# Patient Record
Sex: Female | Born: 1980 | Race: White | Hispanic: No | Marital: Married | Smoking: Former smoker
Health system: Southern US, Community
[De-identification: ages and names within clinical notes are randomized; demographics above are authoritative.]

## PROBLEM LIST (undated history)

## (undated) ENCOUNTER — Inpatient Hospital Stay (HOSPITAL_COMMUNITY): Payer: BC Managed Care – PPO

## (undated) DIAGNOSIS — O09899 Supervision of other high risk pregnancies, unspecified trimester: Secondary | ICD-10-CM

## (undated) DIAGNOSIS — K900 Celiac disease: Secondary | ICD-10-CM

## (undated) HISTORY — DX: Supervision of other high risk pregnancies, unspecified trimester: O09.899

## (undated) HISTORY — DX: Celiac disease: K90.0

---

## 2005-10-30 DIAGNOSIS — K900 Celiac disease: Secondary | ICD-10-CM

## 2005-10-30 HISTORY — DX: Celiac disease: K90.0

## 2006-06-18 ENCOUNTER — Ambulatory Visit: Payer: Self-pay | Admitting: Internal Medicine

## 2006-06-30 HISTORY — PX: ESOPHAGOGASTRODUODENOSCOPY: SHX1529

## 2006-07-04 ENCOUNTER — Ambulatory Visit: Payer: Self-pay | Admitting: Internal Medicine

## 2006-07-04 ENCOUNTER — Ambulatory Visit (HOSPITAL_COMMUNITY): Admission: RE | Admit: 2006-07-04 | Discharge: 2006-07-04 | Payer: Self-pay | Admitting: Internal Medicine

## 2006-07-04 ENCOUNTER — Encounter (INDEPENDENT_AMBULATORY_CARE_PROVIDER_SITE_OTHER): Payer: Self-pay | Admitting: Specialist

## 2006-07-17 ENCOUNTER — Ambulatory Visit: Payer: Self-pay | Admitting: Internal Medicine

## 2007-01-24 ENCOUNTER — Ambulatory Visit: Payer: Self-pay | Admitting: Gastroenterology

## 2010-06-21 ENCOUNTER — Encounter (INDEPENDENT_AMBULATORY_CARE_PROVIDER_SITE_OTHER): Payer: Self-pay | Admitting: *Deleted

## 2010-11-29 NOTE — Letter (Signed)
Summary: Recall Radiology  [facility]  [address]   [place], [place] [address]   Phone: [phone]  Fax: [phone]    June 21, 2010  KATHLYN LEACHMAN [address] [place], [place]  [address] 1981-02-21   Dear Ms. Renaldo Fiddler,   Our office needs to get you scheduled for your Bone Density Study. Please give our office a call to schedule this.  You may call the office at your convenience at [phone].  Please ask for the Referral Coordinator to make arrangements for this to be scheduled.  You may have to leave a message on our voice mail.  We will return your call.  If for any reason you do not wish to schedule this, please advise the office.  Please do not neglect your health.   Thank you,    Ave Filter  [facility] Ph: [phone]   Fax: [phone]

## 2011-03-17 NOTE — Op Note (Signed)
NAME:  Olivia Vance, Olivia Vance NO.:  [id number]   MEDICAL RECORD NO.:  [id number]          PATIENT TYPE:  AMB   LOCATION:  DAY                           FACILITY:  [facility]   PHYSICIAN:  R. Roetta Sessions, M.D. DATE OF BIRTH:  06/23/1981   DATE OF PROCEDURE:  07/04/2006  DATE OF DISCHARGE:                                 OPERATIVE REPORT   PROCEDURE PERFORMED:  Esophagogastroduodenoscopy with small bowel biopsy.   ENDOSCOPIST:  Jonathon Bellows, M.D.   INDICATIONS FOR THE PROCEDURE:  The patient is a 30 year old lady who came  to see me recently for further evaluation of a two to three-year history of  intermittent diarrhea.  She does have a significant nocturnal component.  I  sent off a celiac antibody panel and the entire antibody specimen came back  positive.  She was started on a gluten-free diet last week and already her  diarrhea has markedly improved, and she can sleep through the night now  without having to get up to have a bowel movement.   Of note, labs recently revealed leukopenia and anemia on June 28, 2006 her  white count was 3.4, H&H was 8.3 and 27.5, MCV  63.2  EGD is now being done  to biopsy her small bowel to nail down the diagnosis of celiac disease.  This procedure was discussed with the patient at length; including the  potential risks, benefits and alternatives having been reviewed. Questions  were answered; please see the  documentation in the medical record.   DESCRIPTION OF THE PROCEDURE:  Oxygen saturation, blood pressure, pulse and  respirations were monitored throughout the entire procedure.  Conscious  sedation consisted of Versed 6 mg and 125 mg of Demerol IV in divided doses.  The instrument consisted of the Olympus video system.   Findings:  Examination to the esophagus revealed no abnormalities.   The gastroesophageal junction was easily traversed.   On examination of the stomach the gastric cavity was empty and __________  there.   Thorough examination of the gastric mucosa was done.  Retroflexion  in the proximal stomach and esophagogastric junction demonstrated only a  small hiatal hernia.   The pylorus was patent and easily traversed.  Examination of the bulb and  the second portion revealed markedly abnormal appearance of the duodenum.  The bulb was elongated and large.  The second and third portions had a pale  appearance with loss of the typical folds seen, and there was scalloping of  the mucosa diffusely.  Multiple biopsies of the second and third portions of  the duodenum were taken for histologic study.   The patient tolerated the procedure well.  The patient was reactive after  the endoscopy.   IMPRESSION:  1. Normal esophagus.  2. Small hiatal hernia.  3. Otherwise normal stomach.  4. Patent pylorus.  5. Elongated pale-appearing duodenal bulb.  6. Abnormal-appearing second and third portions of the duodenum as      described above; biopsy.   RECOMMENDATIONS:  1. Continue gluten-free diet with internal celiac disease __________ .  2. We are going  to check a baseline B12 and serum folate level.  3. Further recommendations to follow.      Jonathon Bellows, M.D.  Electronically Signed     RMR/MEDQ  D:  07/04/2006  T:  07/05/2006  Job:  [id number]   cc:   Almetta Lovely  Fax: [phone]   Fara Chute  Fax: [phone]

## 2011-03-31 DIAGNOSIS — O09899 Supervision of other high risk pregnancies, unspecified trimester: Secondary | ICD-10-CM

## 2011-03-31 HISTORY — DX: Supervision of other high risk pregnancies, unspecified trimester: O09.899

## 2011-04-19 ENCOUNTER — Inpatient Hospital Stay (HOSPITAL_COMMUNITY)
Admission: AD | Admit: 2011-04-19 | Discharge: 2011-04-23 | DRG: 372 | Disposition: A | Discharge status: Home / Self Care | Payer: BC Managed Care – PPO | Attending: Obstetrics and Gynecology | Admitting: Obstetrics and Gynecology

## 2011-04-19 DIAGNOSIS — O1414 Severe pre-eclampsia complicating childbirth: Principal | ICD-10-CM | POA: Diagnosis present

## 2011-04-19 LAB — LACTATE DEHYDROGENASE: LDH: 267 U/L — ABNORMAL HIGH (ref 94–250)

## 2011-04-19 LAB — COMPREHENSIVE METABOLIC PANEL
BUN: 7 mg/dL (ref 6–23)
Calcium: 8.4 mg/dL (ref 8.4–10.5)
GFR calc Af Amer: >60 mL/min (ref >60)
Glucose, Bld: 84 mg/dL (ref 70–99)
Sodium: 133 mEq/L — ABNORMAL LOW (ref 135–145)
Total Protein: 5.4 g/dL — ABNORMAL LOW (ref 6.0–8.3)

## 2011-04-19 LAB — CBC
Hemoglobin: 11.7 g/dL — ABNORMAL LOW (ref 12.0–15.0)
MCH: 30.2 pg (ref 26.0–34.0)
MCHC: 33.9 g/dL (ref 30.0–36.0)

## 2011-04-19 LAB — URIC ACID: Uric Acid, Serum: 9.2 mg/dL — ABNORMAL HIGH (ref 2.4–7.0)

## 2011-04-20 ENCOUNTER — Inpatient Hospital Stay (HOSPITAL_COMMUNITY): Payer: BC Managed Care – PPO

## 2011-04-20 LAB — MAGNESIUM
Magnesium: 6.6 mg/dL (ref 1.5–2.5)
Magnesium: 6.6 mg/dL (ref 1.5–2.5)

## 2011-04-20 LAB — COMPREHENSIVE METABOLIC PANEL
ALT: 178 U/L — ABNORMAL HIGH (ref 0–35)
AST: 67 U/L — ABNORMAL HIGH (ref 0–37)
AST: 229 U/L — ABNORMAL HIGH (ref 0–37)
Albumin: 2 g/dL — ABNORMAL LOW (ref 3.5–5.2)
CO2: 19 mEq/L (ref 19–32)
CO2: 21 mEq/L (ref 19–32)
Calcium: 7.5 mg/dL — ABNORMAL LOW (ref 8.4–10.5)
Calcium: 7.1 mg/dL — ABNORMAL LOW (ref 8.4–10.5)
Creatinine, Ser: 0.92 mg/dL (ref 0.50–1.10)
GFR calc non Af Amer: >60 mL/min (ref >60)
Sodium: 128 mEq/L — ABNORMAL LOW (ref 135–145)
Total Protein: 5 g/dL — ABNORMAL LOW (ref 6.0–8.3)

## 2011-04-20 LAB — CBC
MCH: 31.7 pg (ref 26.0–34.0)
MCH: 30.2 pg (ref 26.0–34.0)
MCHC: 34.3 g/dL (ref 30.0–36.0)
MCV: 88.2 fL (ref 78.0–100.0)
Platelets: 66 10*3/uL — ABNORMAL LOW (ref 150–400)
Platelets: 54 10*3/uL — ABNORMAL LOW (ref 150–400)
RBC: 3.25 MIL/uL — ABNORMAL LOW (ref 3.87–5.11)
RDW: 13.4 % (ref 11.5–15.5)

## 2011-04-20 LAB — LIPASE, BLOOD: Lipase: 18 U/L (ref 11–59)

## 2011-04-20 LAB — AMYLASE: Amylase: 31 U/L (ref 0–105)

## 2011-04-20 LAB — URIC ACID: Uric Acid, Serum: 9.4 mg/dL — ABNORMAL HIGH (ref 2.4–7.0)

## 2011-04-21 LAB — DIFFERENTIAL
Basophils Absolute: 0 10*3/uL (ref 0.0–0.1)
Basophils Relative: 0 % (ref 0–1)
Neutro Abs: 3.7 10*3/uL (ref 1.7–7.7)
Neutrophils Relative %: 70 % (ref 43–77)

## 2011-04-21 LAB — COMPREHENSIVE METABOLIC PANEL
ALT: 313 U/L — ABNORMAL HIGH (ref 0–35)
AST: 440 U/L — ABNORMAL HIGH (ref 0–37)
Albumin: 1.9 g/dL — ABNORMAL LOW (ref 3.5–5.2)
Alkaline Phosphatase: 135 U/L — ABNORMAL HIGH (ref 39–117)
Alkaline Phosphatase: 155 U/L — ABNORMAL HIGH (ref 39–117)
BUN: 7 mg/dL (ref 6–23)
CO2: 24 mEq/L (ref 19–32)
CO2: 23 mEq/L (ref 19–32)
Chloride: 103 mEq/L (ref 96–112)
Creatinine, Ser: 0.6 mg/dL (ref 0.50–1.10)
GFR calc Af Amer: >60 mL/min (ref >60)
GFR calc Af Amer: >60 mL/min (ref >60)
GFR calc non Af Amer: >60 mL/min (ref >60)
GFR calc non Af Amer: >60 mL/min (ref >60)
Glucose, Bld: 89 mg/dL (ref 70–99)
Glucose, Bld: 133 mg/dL — ABNORMAL HIGH (ref 70–99)
Potassium: 4.3 mEq/L (ref 3.5–5.1)
Potassium: 4.4 mEq/L (ref 3.5–5.1)
Sodium: 128 mEq/L — ABNORMAL LOW (ref 135–145)
Total Bilirubin: 0.4 mg/dL (ref 0.3–1.2)
Total Protein: 4.4 g/dL — ABNORMAL LOW (ref 6.0–8.3)

## 2011-04-21 LAB — CBC
HCT: 26.2 % — ABNORMAL LOW (ref 36.0–46.0)
Hemoglobin: 7.8 g/dL — ABNORMAL LOW (ref 12.0–15.0)
Hemoglobin: 8.9 g/dL — ABNORMAL LOW (ref 12.0–15.0)
MCV: 87.6 fL (ref 78.0–100.0)
Platelets: 14 10*3/uL — CL (ref 150–400)
RBC: 2.6 MIL/uL — ABNORMAL LOW (ref 3.87–5.11)
RDW: 14.3 % (ref 11.5–15.5)
WBC: 6 10*3/uL (ref 4.0–10.5)

## 2011-04-21 LAB — LACTATE DEHYDROGENASE: LDH: 925 U/L — ABNORMAL HIGH (ref 94–250)

## 2011-04-21 LAB — URIC ACID: Uric Acid, Serum: 8.9 mg/dL — ABNORMAL HIGH (ref 2.4–7.0)

## 2011-04-22 LAB — MAGNESIUM: Magnesium: 3.5 mg/dL — ABNORMAL HIGH (ref 1.5–2.5)

## 2011-04-22 LAB — CBC
HCT: 20.4 % — ABNORMAL LOW (ref 36.0–46.0)
Hemoglobin: 6.9 g/dL — CL (ref 12.0–15.0)
MCV: 88.3 fL (ref 78.0–100.0)
RDW: 14.2 % (ref 11.5–15.5)
WBC: 8 10*3/uL (ref 4.0–10.5)

## 2011-04-22 LAB — LACTATE DEHYDROGENASE: LDH: 519 U/L — ABNORMAL HIGH (ref 94–250)

## 2011-04-22 LAB — COMPREHENSIVE METABOLIC PANEL
Albumin: 1.7 g/dL — ABNORMAL LOW (ref 3.5–5.2)
Alkaline Phosphatase: 118 U/L — ABNORMAL HIGH (ref 39–117)
BUN: 8 mg/dL (ref 6–23)
CO2: 23 mEq/L (ref 19–32)
Chloride: 107 mEq/L (ref 96–112)
Creatinine, Ser: 0.67 mg/dL (ref 0.50–1.10)
GFR calc Af Amer: >60 mL/min (ref >60)
GFR calc non Af Amer: >60 mL/min (ref >60)
Glucose, Bld: 112 mg/dL — ABNORMAL HIGH (ref 70–99)
Potassium: 4 mEq/L (ref 3.5–5.1)
Total Bilirubin: 0.2 mg/dL — ABNORMAL LOW (ref 0.3–1.2)

## 2011-04-23 LAB — COMPREHENSIVE METABOLIC PANEL
ALT: 140 U/L — ABNORMAL HIGH (ref 0–35)
AST: 61 U/L — ABNORMAL HIGH (ref 0–37)
Calcium: 8.1 mg/dL — ABNORMAL LOW (ref 8.4–10.5)
Creatinine, Ser: 0.63 mg/dL (ref 0.50–1.10)
GFR calc non Af Amer: >60 mL/min (ref >60)
Sodium: 140 mEq/L (ref 135–145)
Total Protein: 4.2 g/dL — ABNORMAL LOW (ref 6.0–8.3)

## 2011-04-23 LAB — CBC
MCH: 30.4 pg (ref 26.0–34.0)
MCHC: 33.5 g/dL (ref 30.0–36.0)
MCV: 90.6 fL (ref 78.0–100.0)
Platelets: 68 10*3/uL — ABNORMAL LOW (ref 150–400)
RDW: 14.3 % (ref 11.5–15.5)

## 2011-04-23 LAB — URIC ACID: Uric Acid, Serum: 8.8 mg/dL — ABNORMAL HIGH (ref 2.4–7.0)

## 2011-05-13 ENCOUNTER — Inpatient Hospital Stay (HOSPITAL_COMMUNITY)
Admission: AD | Admit: 2011-05-13 | Discharge: 2011-05-13 | Disposition: A | Discharge status: Home / Self Care | Payer: BC Managed Care – PPO | Source: Ambulatory Visit | Attending: Obstetrics and Gynecology | Admitting: Obstetrics and Gynecology

## 2011-05-13 ENCOUNTER — Encounter (HOSPITAL_COMMUNITY): Payer: Self-pay | Admitting: *Deleted

## 2011-05-13 DIAGNOSIS — O9122 Nonpurulent mastitis associated with the puerperium: Secondary | ICD-10-CM | POA: Insufficient documentation

## 2011-05-13 DIAGNOSIS — N61 Mastitis without abscess: Secondary | ICD-10-CM

## 2011-05-13 LAB — COMPREHENSIVE METABOLIC PANEL
ALT: 31 U/L (ref 0–35)
AST: 22 U/L (ref 0–37)
CO2: 23 mEq/L (ref 19–32)
Chloride: 95 mEq/L — ABNORMAL LOW (ref 96–112)
GFR calc Af Amer: >60 mL/min (ref >60)
GFR calc non Af Amer: >60 mL/min (ref >60)
Glucose, Bld: 99 mg/dL (ref 70–99)
Sodium: 128 mEq/L — ABNORMAL LOW (ref 135–145)
Total Bilirubin: 0.5 mg/dL (ref 0.3–1.2)

## 2011-05-13 LAB — DIFFERENTIAL
Basophils Absolute: 0 10*3/uL (ref 0.0–0.1)
Eosinophils Relative: 0 % (ref 0–5)
Lymphocytes Relative: 6 % — ABNORMAL LOW (ref 12–46)
Lymphs Abs: 0.5 10*3/uL — ABNORMAL LOW (ref 0.7–4.0)
Monocytes Absolute: 0.4 10*3/uL (ref 0.1–1.0)
Neutro Abs: 7.4 10*3/uL (ref 1.7–7.7)

## 2011-05-13 LAB — CBC
HCT: 31.9 % — ABNORMAL LOW (ref 36.0–46.0)
MCV: 86 fL (ref 78.0–100.0)
Platelets: 174 10*3/uL (ref 150–400)
RBC: 3.71 MIL/uL — ABNORMAL LOW (ref 3.87–5.11)
RDW: 14.4 % (ref 11.5–15.5)
WBC: 8.3 10*3/uL (ref 4.0–10.5)

## 2011-05-13 LAB — URINALYSIS, ROUTINE W REFLEX MICROSCOPIC
Bilirubin Urine: NEGATIVE
Ketones, ur: NEGATIVE mg/dL
Nitrite: NEGATIVE
Protein, ur: NEGATIVE mg/dL
Urobilinogen, UA: 0.2 mg/dL (ref 0.0–1.0)

## 2011-05-13 LAB — URINE MICROSCOPIC-ADD ON

## 2011-05-13 NOTE — Progress Notes (Signed)
MD told to come in ?mastitis, having pain in right breast and fever this morning of 102.

## 2011-05-13 NOTE — Progress Notes (Signed)
Pt reports pain in R breast since yesterday, pt was treated for mastitis 2 weeks ago. ANd is now taking abx, dr. Henderson Cloud called in last night.

## 2011-05-13 NOTE — ED Provider Notes (Addendum)
History   This patient was treated with antibiotics 2 weeks ago for mastitis. She stopped breast feeding at that time and has been using the breast pump. For the past couple days has developed fever and breast tenderness again. Called Dr. Henderson Cloud last pm and started on antibiotics. Here today for evaluation and blood work.  Chief Complaint  Patient presents with  . Fever  . Breast Pain    right    Patient is a 30 y.o. female presenting with fever. The history is provided by the patient.  Fever Primary symptoms of the febrile illness include fever. Primary symptoms do not include headaches. This is a recurrent problem.    OB History    Grav Para Term Preterm Abortions TAB SAB Ect Mult Living   1 1 1       1       No past medical history on file.  No past surgical history on file.  No family history on file.  History  Substance Use Topics  . Smoking status: Not on file  . Smokeless tobacco: Not on file  . Alcohol Use: Not on file    Allergies: No Known Allergies  No prescriptions prior to admission    Review of Systems  Constitutional: Positive for fever.  Eyes: Negative for blurred vision and double vision.  Genitourinary:       Rt. Breast tenderness and redness.  Neurological: Negative for headaches.   Physical Exam   Blood pressure 111/72, pulse 140, temperature 99.4 F (37.4 C), temperature source Oral, resp. rate 16, height 5\' 5"  (1.651 m), weight 135 lb (61.236 kg), unknown if currently breastfeeding.  Physical Exam  Nursing note and vitals reviewed. Constitutional: She appears well-developed and well-nourished. No distress.  HENT:  Head: Normocephalic.  Eyes: EOM are normal.  Neck: Neck supple.  Respiratory: Effort normal.  Genitourinary: There is breast swelling and tenderness (of the right ).  Musculoskeletal: Normal range of motion.    MAU Course  Procedures  MDM: Dr. Henderson Cloud here to evaluate the patient and review lab results.    [place],  [place] 05/13/11 1239

## 2011-05-13 NOTE — Consult Note (Signed)
Consult with Dr. Henderson Cloud regarding clinical findings on this patient. She request CBC, CMET and UA. Lactation Consultant, Otelia Santee in to talk with patient. Will call Dr. Henderson Cloud with lab results.

## 2011-05-13 NOTE — H&P (Signed)
Addendum to NP note.  Filed Vitals:   05/13/11 1026 05/13/11 1122  BP: 111/72 104/66  Pulse: 140 111  Temp: 99.4 F (37.4 C) 98.9 F (37.2 C)  TempSrc: Oral Oral  Resp: 16 16  Height: 5\' 5"  (1.651 m)   Weight: 61.236 kg (135 lb)     Breasts: No other signs of infection.  Results for orders placed during the hospital encounter of 05/13/11 (from the past 24 hour(s))  CBC     Status: Abnormal   Collection Time   05/13/11 11:11 AM      Component Value Range   WBC 8.3  4.0 - 10.5 (K/uL)   RBC 3.71 (*) 3.87 - 5.11 (MIL/uL)   Hemoglobin 10.2 (*) 12.0 - 15.0 (g/dL)   HCT 09.8 (*) 11.9 - 46.0 (%)   MCV 86.0  78.0 - 100.0 (fL)   MCH 27.5  26.0 - 34.0 (pg)   MCHC 32.0  30.0 - 36.0 (g/dL)   RDW 14.7  82.9 - 56.2 (%)   Platelets 174  150 - 400 (K/uL)  DIFFERENTIAL     Status: Abnormal   Collection Time   05/13/11 11:11 AM      Component Value Range   Neutrophils Relative 89 (*) 43 - 77 (%)   Neutro Abs 7.4  1.7 - 7.7 (K/uL)   Lymphocytes Relative 6 (*) 12 - 46 (%)   Lymphs Abs 0.5 (*) 0.7 - 4.0 (K/uL)   Monocytes Relative 5  3 - 12 (%)   Monocytes Absolute 0.4  0.1 - 1.0 (K/uL)   Eosinophils Relative 0  0 - 5 (%)   Eosinophils Absolute 0.0  0.0 - 0.7 (K/uL)   Basophils Relative 0  0 - 1 (%)   Basophils Absolute 0.0  0.0 - 0.1 (K/uL)  COMPREHENSIVE METABOLIC PANEL     Status: Abnormal   Collection Time   05/13/11 11:11 AM      Component Value Range   Sodium 128 (*) 135 - 145 (mEq/L)   Potassium 3.7  3.5 - 5.1 (mEq/L)   Chloride 95 (*) 96 - 112 (mEq/L)   CO2 23  19 - 32 (mEq/L)   Glucose, Bld 99  70 - 99 (mg/dL)   BUN 11  6 - 23 (mg/dL)   Creatinine, Ser 1.30  0.50 - 1.10 (mg/dL)   Calcium 9.2  8.4 - 86.5 (mg/dL)   Total Protein 7.2  6.0 - 8.3 (g/dL)   Albumin 3.5  3.5 - 5.2 (g/dL)   AST 22  0 - 37 (U/L)   ALT 31  0 - 35 (U/L)   Alkaline Phosphatase 95  39 - 117 (U/L)   Total Bilirubin 0.5  0.3 - 1.2 (mg/dL)   GFR calc non Af Amer >60  >60 (mL/min)   GFR calc Af Amer  >60  >60 (mL/min)   A/P  Mastitis.  No other signs of infection.  WBC normal.  No s/s preeclampsia.  D/C and continue Dicloxacillin.

## 2011-05-31 ENCOUNTER — Telehealth: Payer: Self-pay

## 2011-05-31 NOTE — Telephone Encounter (Signed)
Pt called (she is also known to [place] at [facility]) pt stated she has celiac dz and RMR put her on B-12 injections several years ago and she is now having the same problems as before and wanted to know if RMR would send in Rx for B-12 injections. Informed pt that we have not seen her in the office since 2008 and she would need to have an OV. Pt stated she didn't have her schedule with her and  would call back to schedule appt.

## 2011-06-05 NOTE — Telephone Encounter (Signed)
She definitely needs a return visit

## 2011-06-06 ENCOUNTER — Encounter: Payer: Self-pay | Admitting: Internal Medicine

## 2011-06-06 NOTE — Telephone Encounter (Signed)
No return call from pt. I made OV for 9/21 @ 1030 with LSL and mailed appt card to patient.

## 2011-07-17 ENCOUNTER — Encounter: Payer: Self-pay | Admitting: Gastroenterology

## 2011-07-17 ENCOUNTER — Ambulatory Visit (INDEPENDENT_AMBULATORY_CARE_PROVIDER_SITE_OTHER): Payer: BC Managed Care – PPO | Admitting: Gastroenterology

## 2011-07-17 VITALS — BP 110/70 | HR 86 | Temp 97.9°F | Ht 65.0 in | Wt 127.6 lb

## 2011-07-17 DIAGNOSIS — E538 Deficiency of other specified B group vitamins: Secondary | ICD-10-CM

## 2011-07-17 DIAGNOSIS — R7989 Other specified abnormal findings of blood chemistry: Secondary | ICD-10-CM

## 2011-07-17 DIAGNOSIS — D649 Anemia, unspecified: Secondary | ICD-10-CM | POA: Insufficient documentation

## 2011-07-17 DIAGNOSIS — R945 Abnormal results of liver function studies: Secondary | ICD-10-CM | POA: Insufficient documentation

## 2011-07-17 DIAGNOSIS — K900 Celiac disease: Secondary | ICD-10-CM | POA: Insufficient documentation

## 2011-07-17 NOTE — Progress Notes (Signed)
Cc to PCP 

## 2011-07-17 NOTE — Assessment & Plan Note (Signed)
Celiac disease, no diarrhea. Uneventful pregnancy, however developed HELLP syndrome. Post delivery her hemoglobin was in the 6 range. In July it was up to 8. No recent B12 or iron levels obtained. LFTs were abnormal as well.  Followup on labs. Advised patient that we would not resume B12 injections unless she is proved to be deficient. Recommend her having followup DEXA bone density study as previously requested.  Further recommendations to follow.

## 2011-07-17 NOTE — Progress Notes (Signed)
Primary Care Physician:  Monica Becton, MD  Primary Gastroenterologist:  Roetta Sessions, MD   Chief Complaint  Patient presents with  . Medication Refill    HPI:  Olivia Vance is a 30 y.o. female here for followup of celiac disease. She was last seen in 2008. She called recently requesting B12 injections. She was on them back in 2008 for several months. At that time she was having insomnia which improved with B12 injections. Complains of insomnia again. Since we last saw her she delivered a healthy daughter on 04/20/2011. Her pregnancy was uneventful however post delivery she developed HELLP syndrome.   She maintains a strict gluten free diet. Denies any diarrhea. She is back to her prepregnancy weight. No abdominal pain, heartburn, vomiting. She was unable to have her followup bone density study because she was pregnant. DEXA scan in August of 2008 showed minimal decrease in the patient's bone density at the femoral neck and left total hip with T-scores of -1.01 and -1.32. It was recommended that she have vitamin D level drawn, this was normal. Dr. Jena Gauss advised Os-Cal and repeat study in 3 years.   Current Outpatient Prescriptions  Medication Sig Dispense Refill  . acetaminophen (TYLENOL) 325 MG tablet Take 650 mg by mouth every 6 (six) hours as needed. For pain.         Allergies as of 07/17/2011  . (No Known Allergies)    Past Medical History  Diagnosis Date  . Celiac disease 2007    Biopsy proven.  . Previous pregnancy with HELLP syndrome, antepartum 03/2011    Past Surgical History  Procedure Date  . Esophagogastroduodenoscopy 06/2006    small hh, pale duodenal bulb, 2nd and 3rd portions of duodenum abnormal appearing. Bx c/w celiac disease. Positive serologies.    Family History  Problem Relation Age of Onset  . Irritable bowel syndrome Mother   . GER disease Father     History   Social History  . Marital Status: Married    Spouse Name: N/A    Number of  Children: 1  . Years of Education: N/A   Occupational History  . teacher    Social History Main Topics  . Smoking status: Former Smoker -- 1.0 packs/day    Types: Cigarettes  . Smokeless tobacco: Not on file   Comment: quit last sumer  . Alcohol Use: No  . Drug Use: No  . Sexually Active: Not on file   Other Topics Concern  . Not on file   Social History Narrative   03/2011, daughter born      ROS:  General: Negative for anorexia, weight loss, fever, chills, fatigue, weakness. Eyes: Negative for vision changes.  ENT: Negative for hoarseness, difficulty swallowing , nasal congestion. CV: Negative for chest pain, angina, palpitations, dyspnea on exertion, peripheral edema.  Respiratory: Negative for dyspnea at rest, dyspnea on exertion, cough, sputum, wheezing.  GI: See history of present illness. GU:  Negative for dysuria, hematuria, urinary incontinence, urinary frequency, nocturnal urination.  MS: Negative for joint pain, low back pain.  Derm: Negative for rash or itching.  Neuro: Negative for weakness, abnormal sensation, seizure, frequent headaches, memory loss, confusion. Complains of insomnia. Psych: Negative for anxiety, depression, suicidal ideation, hallucinations.  Endo: Negative for unusual weight change.  Heme: Negative for bruising or bleeding. Allergy: Negative for rash or hives.    Physical Examination:  BP 110/70  Pulse 86  Temp(Src) 97.9 F (36.6 C) (Temporal)  Ht 5\' 5"  (1.651 m)  Wt 127 lb 9.6 oz (57.879 kg)  BMI 21.23 kg/m2  Breastfeeding? No   General: Well-nourished, well-developed in no acute distress.  Head: Normocephalic, atraumatic.   Eyes: Conjunctiva pink, no icterus. Mouth: Oropharyngeal mucosa moist and pink , no lesions erythema or exudate. Neck: Supple without thyromegaly, masses, or lymphadenopathy.  Lungs: Clear to auscultation bilaterally.  Heart: Regular rate and rhythm, no murmurs rubs or gallops.  Abdomen: Bowel sounds are  normal, nontender, nondistended, no hepatosplenomegaly or masses, no abdominal bruits or    hernia , no rebound or guarding.   Rectal: Not performed Extremities: No lower extremity edema. No clubbing or deformities.  Neuro: Alert and oriented x 4 , grossly normal neurologically.  Skin: Warm and dry, no rash or jaundice.   Psych: Alert and cooperative, normal mood and affect.  Labs: Lab Results  Component Value Date   WBC 8.3 05/13/2011   HGB 10.2* 05/13/2011   HCT 31.9* 05/13/2011   MCV 86.0 05/13/2011   PLT 174 05/13/2011     .  Imaging Studies: No results found.

## 2011-07-20 ENCOUNTER — Other Ambulatory Visit (HOSPITAL_COMMUNITY): Payer: BC Managed Care – PPO

## 2011-07-20 LAB — HEPATIC FUNCTION PANEL
ALT: 56 U/L — AB (ref 7–35)
Albumin: 4.4
Total Bilirubin: 0.4 mg/dL
Vitamin B12 Deficiency Panel: 371

## 2011-07-20 LAB — CBC WITH DIFFERENTIAL/PLATELET
HCT: 35 %
Hemoglobin: 10.8 g/dL — AB (ref 12.0–16.0)
MCV: 76.9 fL
WBC: 3.7
platelet count: 177

## 2011-07-21 ENCOUNTER — Ambulatory Visit: Payer: BC Managed Care – PPO | Admitting: Gastroenterology

## 2011-07-31 NOTE — Progress Notes (Signed)
Quick Note:  Please let pt know, she remains with mild anemia and her ferritin (iron stores) is very low. Likely combination of recent pregnancy and Celiac. Her B12 is normal.  Her ALT is mildly elevated.  Recheck LFTs, ferritin, CBC in 3 months.  She needs to take iron supplement, such as ferrous sulfate 325mg  BID. Take MVI daily. Take Calcium with vit D daily. Continue to be strict on gluten free diet. Await bone density study. OV in six months. ______

## 2011-08-02 ENCOUNTER — Other Ambulatory Visit: Payer: Self-pay | Admitting: Gastroenterology

## 2011-08-02 DIAGNOSIS — D649 Anemia, unspecified: Secondary | ICD-10-CM

## 2011-08-02 DIAGNOSIS — R7989 Other specified abnormal findings of blood chemistry: Secondary | ICD-10-CM

## 2011-08-02 DIAGNOSIS — K900 Celiac disease: Secondary | ICD-10-CM

## 2012-01-29 ENCOUNTER — Encounter: Payer: Self-pay | Admitting: Internal Medicine

## 2012-08-15 ENCOUNTER — Telehealth: Payer: Self-pay | Admitting: Gastroenterology

## 2012-08-15 NOTE — Telephone Encounter (Signed)
Patient never had her f/u OV or labs this year. Please have her make OV with RMR only. Can make arrangement for necessary labs at that time.

## 2012-09-06 ENCOUNTER — Telehealth: Payer: Self-pay | Admitting: Internal Medicine

## 2012-09-06 NOTE — Telephone Encounter (Signed)
Per LSL patient needs labs and OV with RMR only. She is past due on having this done. I will not have any OV until January with RMR, but you can see what labs she needs and once schedule is available i will make OV with RMR ONLY

## 2012-09-10 ENCOUNTER — Encounter: Payer: Self-pay | Admitting: Internal Medicine

## 2012-09-10 NOTE — Telephone Encounter (Signed)
Pt is aware of OV on 11/08/2012 @ 8 am with RMR and appt card was mailed

## 2012-09-10 NOTE — Telephone Encounter (Signed)
Pt is aware of OV on 1/10 @ 8 with RMR and appt card was mailed

## 2012-09-10 NOTE — Telephone Encounter (Signed)
Per LSL- we will figure out necessary labs when pt comes in for ov. Pt just needs ov.

## 2012-11-08 ENCOUNTER — Ambulatory Visit: Payer: BC Managed Care – PPO | Admitting: Internal Medicine

## 2012-12-24 ENCOUNTER — Other Ambulatory Visit: Payer: Self-pay

## 2012-12-24 ENCOUNTER — Ambulatory Visit (INDEPENDENT_AMBULATORY_CARE_PROVIDER_SITE_OTHER): Payer: BC Managed Care – PPO | Admitting: Internal Medicine

## 2012-12-24 ENCOUNTER — Encounter: Payer: Self-pay | Admitting: Internal Medicine

## 2012-12-24 ENCOUNTER — Other Ambulatory Visit: Payer: Self-pay | Admitting: Internal Medicine

## 2012-12-24 VITALS — BP 128/84 | HR 93 | Temp 97.0°F | Ht 65.0 in | Wt 126.0 lb

## 2012-12-24 DIAGNOSIS — R7989 Other specified abnormal findings of blood chemistry: Secondary | ICD-10-CM

## 2012-12-24 DIAGNOSIS — K900 Celiac disease: Secondary | ICD-10-CM

## 2012-12-24 DIAGNOSIS — R945 Abnormal results of liver function studies: Secondary | ICD-10-CM

## 2012-12-24 NOTE — Patient Instructions (Addendum)
Continue gluten-free diet  CBC, LFt's through Dr. Kathi Der office  Bone density study

## 2012-12-24 NOTE — Progress Notes (Signed)
Primary Care Physician:  Rudi Heap, MD Primary Gastroenterologist:  Dr. Jena Gauss  Pre-Procedure History & Physical: HPI:  Olivia Vance is a 32 y.o. female here for followup of for celiac disease (positive TTG IgA and small bowel biopsy).  Pregnancy complicate with HELLP syndrome. History of mildly elevated LFTs. Doing well at this time. States she's adherent to a gluten-free diet .  However, patient feels she's anemic. She craves ice. No diarrhea abdominal pain nausea vomiting or reflux symptoms. Previously on B12 supplements stop this agent in the past. Vitamin D, 25 OH level normal at 70.7 on 06/20/2007. She is due for a bone density study at this time.  Past Medical History  Diagnosis Date  . Celiac disease 2007    Biopsy proven.  . Previous pregnancy with HELLP syndrome, antepartum 03/2011    Past Surgical History  Procedure Laterality Date  . Esophagogastroduodenoscopy  06/2006    small hh, pale duodenal bulb, 2nd and 3rd portions of duodenum abnormal appearing. Bx c/w celiac disease. Positive serologies.    Prior to Admission medications   Medication Sig Start Date End Date Taking? Authorizing Provider  acetaminophen (TYLENOL) 325 MG tablet Take 650 mg by mouth every 6 (six) hours as needed. For pain.    Yes Historical Provider, MD  [facility] 1/35, 28, tablet Take 1 tablet by mouth daily.  11/06/12  Yes Historical Provider, MD    Allergies as of 12/24/2012  . (No Known Allergies)    Family History  Problem Relation Age of Onset  . Irritable bowel syndrome Mother   . GER disease Father     History   Social History  . Marital Status: Married    Spouse Name: N/A    Number of Children: 1  . Years of Education: N/A   Occupational History  . teacher    Social History Main Topics  . Smoking status: Former Smoker -- 1.00 packs/day    Types: Cigarettes  . Smokeless tobacco: Not on file     Comment: quit last sumer  . Alcohol Use: No  . Drug Use: No  . Sexually Active: Not  on file   Other Topics Concern  . Not on file   Social History Narrative   03/2011, daughter born    Review of Systems: See HPI, otherwise negative ROS  Physical Exam: BP 128/84  Pulse 93  Temp(Src) 97 F (36.1 C) (Oral)  Ht 5\' 5"  (1.651 m)  Wt 126 lb (57.153 kg)  BMI 20.97 kg/m2  LMP 12/17/2012 General:   Alert,  Well-developed, well-nourished, pleasant and cooperative in NAD Skin:  Intact without significant lesions or rashes. Eyes:  Sclera clear, no icterus.   Conjunctiva pink. Ears:  Normal auditory acuity. Nose:  No deformity, discharge,  or lesions. Mouth:  No deformity or lesions. Neck:  Supple; no masses or thyromegaly. No significant cervical adenopathy. Lungs:  Clear throughout to auscultation.   No wheezes, crackles, or rhonchi. No acute distress. Heart:  Regular rate and rhythm; no murmurs, clicks, rubs,  or gallops. Abdomen: Non-distended, normal bowel sounds.  Soft and nontender without appreciable mass or hepatosplenomegaly.  Pulses:  Normal pulses noted. Extremities:  Without clubbing or edema.  Impression/Plan: Pleasant 32 year old lady with celiac disease. She is well versed in this disease. She is following a gluten-free closely as she reports. She craves ice and feels she may be anemic. She has not had any recent labs. History of mildly elevated LFTs. History of help syndrome. History of osteopenia on  prior bone density study   Recommendations: Bone density study now. CBC and LFTs now. Continue gluten-free diet. She should be on a fat soluble multivitamin supplement daily. Further recommendations to follow.

## 2012-12-31 ENCOUNTER — Ambulatory Visit (HOSPITAL_COMMUNITY)
Admission: RE | Admit: 2012-12-31 | Discharge: 2012-12-31 | Disposition: A | Discharge status: Home / Self Care | Payer: BC Managed Care – PPO | Source: Ambulatory Visit | Attending: Internal Medicine | Admitting: Internal Medicine

## 2012-12-31 DIAGNOSIS — Z13820 Encounter for screening for osteoporosis: Secondary | ICD-10-CM | POA: Insufficient documentation

## 2012-12-31 DIAGNOSIS — K900 Celiac disease: Secondary | ICD-10-CM | POA: Insufficient documentation

## 2013-01-08 ENCOUNTER — Encounter: Payer: Self-pay | Admitting: Internal Medicine

## 2013-01-08 ENCOUNTER — Other Ambulatory Visit: Payer: Self-pay | Admitting: Internal Medicine

## 2013-01-08 NOTE — Progress Notes (Signed)
Patient ID: Olivia Vance, female   DOB: 02-24-1981, 31 y.o.   MRN: [id number] Bone density study has come back normal. I recommend at least 1200 mg of calcium intake daily and at least 400-800 international units of vitamin D daily. I also recommend repeating a bone density study in 3 years. Please let patient know.

## 2013-01-08 NOTE — Progress Notes (Signed)
Patient ID: Olivia Vance, female   DOB: [birth year], 31 y.o.   MRN: [id number] LFTs okay. Hemoglobin and hematocrit 9.8 and 30.7. MCV 73.1. Need to obtain an serum iron, iron binding capacity and ferritin in the near future to further evaluate for iron deficiency. Please order and let patient know.

## 2013-01-08 NOTE — Progress Notes (Signed)
Patient ID: Olivia Vance, female   DOB: December 28, 1980, 31 y.o.   MRN: [id number] Labs through [facility] from February 26 indicate a hemoglobin and hematocrit of 9.8 and 30.7. MCV 73.1; liver profile okay. Will pursue iron studies. May have iron deficiency anemia. Need serum iron, IBC and ferritin in the near future. Please let patient know.

## 2013-01-09 ENCOUNTER — Other Ambulatory Visit: Payer: Self-pay | Admitting: Internal Medicine

## 2013-01-09 ENCOUNTER — Other Ambulatory Visit: Payer: Self-pay

## 2013-01-09 DIAGNOSIS — D649 Anemia, unspecified: Secondary | ICD-10-CM

## 2013-01-09 NOTE — Progress Notes (Signed)
Tried to call pt- LMOM 

## 2013-01-09 NOTE — Progress Notes (Signed)
Tried to call pt-lmom 

## 2013-01-10 NOTE — Progress Notes (Signed)
Tried to call pt- LMOM 

## 2013-01-14 NOTE — Progress Notes (Signed)
Pt aware, lab order mailed to pt.

## 2013-01-14 NOTE — Progress Notes (Signed)
Pt is aware, lab order mailed to pt. She is going to have them done at [facility].

## 2013-01-14 NOTE — Progress Notes (Signed)
Pt aware.  Darl Pikes, please nic bone density in 3 years.

## 2013-01-17 NOTE — Progress Notes (Signed)
Reminder in epic °

## 2013-01-23 ENCOUNTER — Other Ambulatory Visit: Payer: BC Managed Care – PPO

## 2013-01-24 LAB — IRON AND TIBC
Iron: <10 ug/dL — ABNORMAL LOW (ref 42–145)
UIBC: 431 ug/dL — ABNORMAL HIGH (ref 125–400)

## 2013-01-27 ENCOUNTER — Other Ambulatory Visit: Payer: Self-pay

## 2013-01-27 ENCOUNTER — Other Ambulatory Visit: Payer: Self-pay | Admitting: Internal Medicine

## 2013-01-27 DIAGNOSIS — K900 Celiac disease: Secondary | ICD-10-CM

## 2013-01-27 DIAGNOSIS — D649 Anemia, unspecified: Secondary | ICD-10-CM

## 2013-01-27 DIAGNOSIS — E538 Deficiency of other specified B group vitamins: Secondary | ICD-10-CM

## 2013-01-27 MED ORDER — FERROUS SULFATE 325 (65 FE) MG PO TABS: 325.0000 mg | ORAL_TABLET | Freq: Three times a day (TID) | ORAL | Status: DC

## 2013-02-05 ENCOUNTER — Ambulatory Visit (INDEPENDENT_AMBULATORY_CARE_PROVIDER_SITE_OTHER): Payer: BC Managed Care – PPO | Admitting: *Deleted

## 2013-02-05 ENCOUNTER — Other Ambulatory Visit (INDEPENDENT_AMBULATORY_CARE_PROVIDER_SITE_OTHER): Payer: BC Managed Care – PPO

## 2013-02-05 DIAGNOSIS — K900 Celiac disease: Secondary | ICD-10-CM

## 2013-02-05 DIAGNOSIS — E538 Deficiency of other specified B group vitamins: Secondary | ICD-10-CM

## 2013-02-05 DIAGNOSIS — D649 Anemia, unspecified: Secondary | ICD-10-CM

## 2013-02-05 DIAGNOSIS — Z23 Encounter for immunization: Secondary | ICD-10-CM

## 2013-02-05 NOTE — Progress Notes (Signed)
Patient tolerated well.

## 2013-02-05 NOTE — Progress Notes (Signed)
Patient came in with orders from another doctor °

## 2013-02-05 NOTE — Patient Instructions (Addendum)

## 2013-02-06 LAB — VITAMIN B12: Vitamin B-12: 455 pg/mL (ref 211–911)

## 2013-02-06 LAB — CAROTENE, SERUM

## 2013-02-06 LAB — TISSUE TRANSGLUTAMINASE, IGA: Tissue Transglutaminase Ab, IgA: 259 U/mL — ABNORMAL HIGH (ref <20)

## 2013-02-06 LAB — VITAMIN D 25 HYDROXY (VIT D DEFICIENCY, FRACTURES): Vit D, 25-Hydroxy: 59 ng/mL (ref 30–89)

## 2013-02-07 LAB — VITAMIN E

## 2013-02-07 LAB — COPPER, SERUM

## 2013-02-12 ENCOUNTER — Other Ambulatory Visit (INDEPENDENT_AMBULATORY_CARE_PROVIDER_SITE_OTHER): Payer: BC Managed Care – PPO

## 2013-02-12 DIAGNOSIS — D649 Anemia, unspecified: Secondary | ICD-10-CM

## 2013-02-12 NOTE — Progress Notes (Signed)
Patient came in for labs only.

## 2013-02-14 LAB — VITAMIN E
Gamma-Tocopherol (Vit E): 0.6 mg/L (ref <=4.3)
Vitamin E (Alpha Tocopherol): 7 mg/L (ref 5.7–19.9)

## 2013-02-14 LAB — COPPER, SERUM: Copper: 106 ug/dL (ref 70–175)

## 2013-02-14 LAB — ZINC: Zinc: 56 ug/dL — ABNORMAL LOW (ref 60–130)

## 2013-02-25 ENCOUNTER — Other Ambulatory Visit: Payer: Self-pay | Admitting: Internal Medicine

## 2013-02-25 DIAGNOSIS — E538 Deficiency of other specified B group vitamins: Secondary | ICD-10-CM

## 2013-02-25 DIAGNOSIS — K900 Celiac disease: Secondary | ICD-10-CM

## 2013-02-25 DIAGNOSIS — D649 Anemia, unspecified: Secondary | ICD-10-CM

## 2013-02-25 NOTE — Progress Notes (Signed)
Patient has a recall for July 2014

## 2013-02-26 NOTE — Progress Notes (Signed)
Referral has been faxed to the nutritionist at [facility] will contact Olivia Vance with date & time

## 2013-02-27 ENCOUNTER — Telehealth (HOSPITAL_COMMUNITY): Payer: Self-pay | Admitting: Dietician

## 2013-02-27 NOTE — Telephone Encounter (Signed)
Called and left message on pt voicemail at [phone].

## 2013-02-27 NOTE — Telephone Encounter (Signed)
Received referral via fax from [facility] for dx: celiac disease.

## 2013-03-05 NOTE — Telephone Encounter (Signed)
Pt did not respond to previous contact attempt. Sent letter to pt home via [place] [employer] in attempt to contact pt to schedule appointment on 03/04/13.

## 2013-03-10 NOTE — Telephone Encounter (Signed)
Pt has not responded to attempts to contact to schedule appointment. Referral filed.  

## 2013-04-22 ENCOUNTER — Other Ambulatory Visit: Payer: Self-pay

## 2013-04-22 DIAGNOSIS — K900 Celiac disease: Secondary | ICD-10-CM

## 2013-04-22 DIAGNOSIS — E538 Deficiency of other specified B group vitamins: Secondary | ICD-10-CM

## 2013-04-22 DIAGNOSIS — D649 Anemia, unspecified: Secondary | ICD-10-CM

## 2013-08-25 ENCOUNTER — Encounter: Payer: Self-pay | Admitting: Family Medicine

## 2013-08-25 ENCOUNTER — Ambulatory Visit (INDEPENDENT_AMBULATORY_CARE_PROVIDER_SITE_OTHER): Payer: BC Managed Care – PPO | Admitting: Family Medicine

## 2013-08-25 VITALS — BP 130/78 | HR 104 | Temp 99.4°F | Ht 66.25 in | Wt 130.6 lb

## 2013-08-25 DIAGNOSIS — R509 Fever, unspecified: Secondary | ICD-10-CM

## 2013-08-25 DIAGNOSIS — R51 Headache: Secondary | ICD-10-CM

## 2013-08-25 DIAGNOSIS — J029 Acute pharyngitis, unspecified: Secondary | ICD-10-CM

## 2013-08-25 LAB — POCT RAPID STREP A (OFFICE): Rapid Strep A Screen: NEGATIVE

## 2013-08-25 LAB — POCT INFLUENZA A/B
Influenza A, POC: NEGATIVE
Influenza B, POC: NEGATIVE

## 2013-08-25 MED ORDER — AZITHROMYCIN 250 MG PO TABS: ORAL_TABLET | ORAL | Status: DC

## 2013-08-25 NOTE — Progress Notes (Signed)
  Subjective:    Patient ID: Olivia Vance, female    DOB: 1981-04-23, 32 y.o.   MRN: [id number]  HPI This 32 y.o. female presents for evaluation of URI sx's, fever, headache, and myalgias.   Review of Systems C/o myalgias, fever, headache, malaise No chest pain, SOB, dizziness, vision change, N/V, diarrhea, constipation, dysuria, urinary urgency or frequency  or rash.     Objective:   Physical Exam Vital signs noted  Well developed well nourished female.  HEENT - Head atraumatic Normocephalic                Eyes - PERRLA, Conjuctiva - clear Sclera- Clear EOMI                Ears - EAC's Wnl TM's Wnl Gross Hearing WNL                Nose - Nares patent                 Throat - oropharanx wnl Respiratory - Lungs CTA bilateral Cardiac - RRR S1 and S2 without murmur GI - Abdomen soft Nontender and bowel sounds active x 4 Extremities - No edema. Neuro - Grossly intact.  Results for orders placed in visit on 08/25/13  POCT INFLUENZA A/B      Result Value Range   Influenza A, POC Negative     Influenza B, POC Negative    POCT RAPID STREP A (OFFICE)      Result Value Range   Rapid Strep A Screen Negative  Negative       Assessment & Plan:  Sore throat - Plan: POCT Influenza A/B, POCT rapid strep A  Fever - Plan: POCT Influenza A/B, POCT rapid strep A  Headache(784.0) - Plan: POCT Influenza A/B Zpak as directed, push po fluids, rest and follow up prn if sx's persist or continue.  Deatra Canter FNP

## 2013-08-25 NOTE — Patient Instructions (Signed)
Sore Throat A sore throat is pain, burning, irritation, or scratchiness of the throat. There is often pain or tenderness when swallowing or talking. A sore throat may be accompanied by other symptoms, such as coughing, sneezing, fever, and swollen neck glands. A sore throat is often the first sign of another sickness, such as a cold, flu, strep throat, or mononucleosis (commonly known as mono). Most sore throats go away without medical treatment. CAUSES  The most common causes of a sore throat include:  A viral infection, such as a cold, flu, or mono.  A bacterial infection, such as strep throat, tonsillitis, or whooping cough.  Seasonal allergies.  Dryness in the air.  Irritants, such as smoke or pollution.  Gastroesophageal reflux disease (GERD). HOME CARE INSTRUCTIONS   Only take over-the-counter medicines as directed by your caregiver.  Drink enough fluids to keep your urine clear or pale yellow.  Rest as needed.  Try using throat sprays, lozenges, or sucking on hard candy to ease any pain (if older than 4 years or as directed).  Sip warm liquids, such as broth, herbal tea, or warm water with honey to relieve pain temporarily. You may also eat or drink cold or frozen liquids such as frozen ice pops.  Gargle with salt water (mix 1 tsp salt with 8 oz of water).  Do not smoke and avoid secondhand smoke.  Put a cool-mist humidifier in your bedroom at night to moisten the air. You can also turn on a hot shower and sit in the bathroom with the door closed for 5 10 minutes. SEEK IMMEDIATE MEDICAL CARE IF:  You have difficulty breathing.  You are unable to swallow fluids, soft foods, or your saliva.  You have increased swelling in the throat.  Your sore throat does not get better in 7 days.  You have nausea and vomiting.  You have a fever or persistent symptoms for more than 2 3 days.  You have a fever and your symptoms suddenly get worse. MAKE SURE YOU:   Understand  these instructions.  Will watch your condition.  Will get help right away if you are not doing well or get worse. Document Released: 11/23/2004 Document Revised: 10/02/2012 Document Reviewed: 06/23/2012 ExitCare Patient Information 2014 [employer].  

## 2013-10-13 ENCOUNTER — Ambulatory Visit: Payer: BC Managed Care – PPO | Admitting: Family Medicine

## 2013-11-28 ENCOUNTER — Other Ambulatory Visit: Payer: Self-pay | Admitting: Obstetrics and Gynecology

## 2013-12-09 ENCOUNTER — Ambulatory Visit: Payer: BC Managed Care – PPO | Admitting: Family Medicine

## 2013-12-09 ENCOUNTER — Encounter: Payer: Self-pay | Admitting: Family Medicine

## 2013-12-09 ENCOUNTER — Ambulatory Visit (INDEPENDENT_AMBULATORY_CARE_PROVIDER_SITE_OTHER): Payer: BC Managed Care – PPO | Admitting: Family Medicine

## 2013-12-09 VITALS — BP 116/76 | HR 135 | Temp 98.9°F | Ht 65.0 in | Wt 136.0 lb

## 2013-12-09 DIAGNOSIS — R51 Headache: Secondary | ICD-10-CM

## 2013-12-09 DIAGNOSIS — R0989 Other specified symptoms and signs involving the circulatory and respiratory systems: Secondary | ICD-10-CM

## 2013-12-09 DIAGNOSIS — J111 Influenza due to unidentified influenza virus with other respiratory manifestations: Secondary | ICD-10-CM

## 2013-12-09 DIAGNOSIS — R509 Fever, unspecified: Secondary | ICD-10-CM

## 2013-12-09 DIAGNOSIS — J029 Acute pharyngitis, unspecified: Secondary | ICD-10-CM

## 2013-12-09 LAB — POCT INFLUENZA A/B
Influenza A, POC: NEGATIVE
Influenza B, POC: POSITIVE

## 2013-12-09 LAB — POCT RAPID STREP A (OFFICE): Rapid Strep A Screen: NEGATIVE

## 2013-12-09 MED ORDER — OSELTAMIVIR PHOSPHATE 75 MG PO CAPS: 75.0000 mg | ORAL_CAPSULE | Freq: Two times a day (BID) | ORAL | Status: DC

## 2013-12-09 NOTE — Patient Instructions (Signed)
Upper Respiratory Infection, Adult An upper respiratory infection (URI) is also known as the common cold. It is often caused by a type of germ (virus). Colds are easily spread (contagious). You can pass it to others by kissing, coughing, sneezing, or drinking out of the same glass. Usually, you get better in 1 or 2 weeks.  HOME CARE   Only take medicine as told by your doctor.  Use a warm mist humidifier or breathe in steam from a hot shower.  Drink enough water and fluids to keep your pee (urine) clear or pale yellow.  Get plenty of rest.  Return to work when your temperature is back to normal or as told by your doctor. You may use a face mask and wash your hands to stop your cold from spreading. GET HELP RIGHT AWAY IF:   After the first few days, you feel you are getting worse.  You have questions about your medicine.  You have chills, shortness of breath, or Chesney or red spit (mucus).  You have yellow or Shelvin snot (nasal discharge) or pain in the face, especially when you bend forward.  You have a fever, puffy (swollen) neck, pain when you swallow, or white spots in the back of your throat.  You have a bad headache, ear pain, sinus pain, or chest pain.  You have a high-pitched whistling sound when you breathe in and out (wheezing).  You have a lasting cough or cough up blood.  You have sore muscles or a stiff neck. MAKE SURE YOU:   Understand these instructions.  Will watch your condition.  Will get help right away if you are not doing well or get worse. Document Released: 04/03/2008 Document Revised: 01/08/2012 Document Reviewed: 02/20/2011 Exit[employer] Patient Information 2014 [employer].  

## 2013-12-09 NOTE — Progress Notes (Signed)
   Subjective:    Patient ID: Olivia Vance, female    DOB: 10/21/1981, 33 y.o.   MRN: [id number]  HPI  This 33 y.o. female presents for evaluation of URI sx's.  She is [redacted] weeks pregnant  Review of Systems No chest pain, SOB, HA, dizziness, vision change, N/V, diarrhea, constipation, dysuria, urinary urgency or frequency, myalgias, arthralgias or rash.     Objective:   Physical Exam  Vital signs noted  Well developed well nourished female.  HEENT - Head atraumatic Normocephalic                Eyes - PERRLA, Conjuctiva - clear Sclera- Clear EOMI                Ears - EAC's Wnl TM's Wnl Gross Hearing WNL                Nose - Nares patent                 Throat - oropharanx wnl Respiratory - Lungs CTA bilateral Cardiac - RRR S1 and S2 without murmur GI - Abdomen soft Nontender and bowel sounds active x 4 Extremities - No edema. Neuro - Grossly intact.      Assessment & Plan:  Chest congestion - Plan: POCT rapid strep A, POCT Influenza A/B  Headache(784.0) - Plan: POCT rapid strep A, POCT Influenza A/B  Sore throat - Plan: POCT rapid strep A, POCT Influenza A/B  Low grade fever - Plan: POCT rapid strep A, POCT Influenza A/B  Flu - Plan: oseltamivir (TAMIFLU) 75 MG capsule  Patient discussed with OBGYN and according to her she was told to be tx'd if she has FLU. Advised patient to call her OBGYN and let them know and if okay take the tamiflu.  Push po fluids, rest, tylenol and motrin otc prn as directed for fever, arthralgias, and myalgias.  Follow up prn if sx's continue or persist.  Lysbeth Penner FNP

## 2013-12-12 ENCOUNTER — Other Ambulatory Visit: Payer: Self-pay | Admitting: Family Medicine

## 2013-12-12 MED ORDER — AZITHROMYCIN 250 MG PO TABS: ORAL_TABLET | ORAL | Status: DC

## 2014-07-02 ENCOUNTER — Other Ambulatory Visit: Payer: Self-pay | Admitting: Obstetrics and Gynecology

## 2014-07-02 NOTE — H&P (Signed)
33 y.o.    G2P1001 68 1/7comes in for a cesarean section at term for frank breech.  Patient has good fetal movement and no bleeding. Pt has had low platelets in pregnancy and now dropped to 94.  Pt and anesthesia both desire c/s to be done before plts drop more. Pt denies any bleeding episodes.    Past Medical History  Diagnosis Date  . Celiac disease 2007    Biopsy proven.  . Previous pregnancy with HELLP syndrome, antepartum 03/2011    Past Surgical History  Procedure Laterality Date  . Esophagogastroduodenoscopy  06/2006    small hh, pale duodenal bulb, 2nd and 3rd portions of duodenum abnormal appearing. Bx c/w celiac disease. Positive serologies.    OB History  Gravida Para Term Preterm AB SAB TAB Ectopic Multiple Living  2 1 1       1     # Outcome Date GA Lbr Len/2nd Weight Sex Delivery Anes PTL Lv  2 CUR           1 TRM 04/20/11    F SVD Other  Y     Comments: HELLP syndrom      History   Social History  . Marital Status: Married    Spouse Name: N/A    Number of Children: 1  . Years of Education: N/A   Occupational History  . teacher    Social History Main Topics  . Smoking status: Former Smoker -- 1.00 packs/day    Types: Cigarettes  . Smokeless tobacco: Not on file     Comment: quit last sumer  . Alcohol Use: No  . Drug Use: No  . Sexual Activity: Not on file   Other Topics Concern  . Not on file   Social History Narrative   03/2011, daughter born   Review of patient's allergies indicates no known allergies.   Prenatal Course: Anemia resolved now with iron.  Low platelets.  Hx of HELLP in previous pregnancy.   Prenatal Transfer Tool  Maternal Diabetes: No Genetic Screening: Normal- NIPT <1:10,000 Maternal Ultrasounds/Referrals: Normal Fetal Ultrasounds or other Referrals:  None Maternal Substance Abuse:  No Significant Maternal Medications:  None Significant Maternal Lab Results: Lab values include: Other: plt 94   There were no vitals filed for  this visit.   Lungs/Cor:  NAD Abdomen:  soft, gravid Ex:  no cords, erythema SVE:  NA FHTs:  Present [place] frank breech  A/P   For cesarean section at term.  All risks, benefits and alternatives discussed with patient and she desires to proceed.  Olivia Vance A

## 2014-07-03 ENCOUNTER — Inpatient Hospital Stay (HOSPITAL_COMMUNITY)
Admission: AD | Admit: 2014-07-03 | Discharge: 2014-07-05 | DRG: 765 | Disposition: A | Discharge status: Home / Self Care | Payer: BC Managed Care – PPO | Source: Ambulatory Visit | Attending: Obstetrics and Gynecology | Admitting: Obstetrics and Gynecology

## 2014-07-03 ENCOUNTER — Encounter (HOSPITAL_COMMUNITY): Payer: BC Managed Care – PPO | Admitting: Certified Registered Nurse Anesthetist

## 2014-07-03 ENCOUNTER — Encounter (HOSPITAL_COMMUNITY)
Admission: AD | Disposition: A | Discharge status: Home / Self Care | Payer: Self-pay | Source: Ambulatory Visit | Attending: Obstetrics and Gynecology

## 2014-07-03 ENCOUNTER — Encounter (HOSPITAL_COMMUNITY): Payer: Self-pay | Admitting: *Deleted

## 2014-07-03 ENCOUNTER — Inpatient Hospital Stay (HOSPITAL_COMMUNITY): Payer: BC Managed Care – PPO | Admitting: Certified Registered Nurse Anesthetist

## 2014-07-03 DIAGNOSIS — O9902 Anemia complicating childbirth: Secondary | ICD-10-CM | POA: Diagnosis present

## 2014-07-03 DIAGNOSIS — O321XX0 Maternal care for breech presentation, not applicable or unspecified: Secondary | ICD-10-CM | POA: Diagnosis present

## 2014-07-03 DIAGNOSIS — Z87891 Personal history of nicotine dependence: Secondary | ICD-10-CM | POA: Diagnosis not present

## 2014-07-03 DIAGNOSIS — D689 Coagulation defect, unspecified: Secondary | ICD-10-CM | POA: Diagnosis present

## 2014-07-03 DIAGNOSIS — D649 Anemia, unspecified: Secondary | ICD-10-CM | POA: Diagnosis present

## 2014-07-03 DIAGNOSIS — O9912 Other diseases of the blood and blood-forming organs and certain disorders involving the immune mechanism complicating childbirth: Secondary | ICD-10-CM

## 2014-07-03 DIAGNOSIS — D696 Thrombocytopenia, unspecified: Secondary | ICD-10-CM | POA: Diagnosis present

## 2014-07-03 DIAGNOSIS — Z9889 Other specified postprocedural states: Secondary | ICD-10-CM

## 2014-07-03 LAB — CBC
HCT: 34.4 % — ABNORMAL LOW (ref 36.0–46.0)
HEMOGLOBIN: 11.2 g/dL — AB (ref 12.0–15.0)
MCH: 26.5 pg (ref 26.0–34.0)
MCHC: 32.6 g/dL (ref 30.0–36.0)
MCV: 81.5 fL (ref 78.0–100.0)
Platelets: 81 10*3/uL — ABNORMAL LOW (ref 150–400)
RBC: 4.22 MIL/uL (ref 3.87–5.11)
RDW: 22.7 % — ABNORMAL HIGH (ref 11.5–15.5)
WBC: 8.9 10*3/uL (ref 4.0–10.5)

## 2014-07-03 LAB — COMPREHENSIVE METABOLIC PANEL
ALT: 27 U/L (ref 0–35)
AST: 37 U/L (ref 0–37)
Albumin: 2.5 g/dL — ABNORMAL LOW (ref 3.5–5.2)
Alkaline Phosphatase: 174 U/L — ABNORMAL HIGH (ref 39–117)
Anion gap: 11 (ref 5–15)
BUN: 6 mg/dL (ref 6–23)
CALCIUM: 8.8 mg/dL (ref 8.4–10.5)
CO2: 19 mEq/L (ref 19–32)
Chloride: 107 mEq/L (ref 96–112)
Creatinine, Ser: 0.63 mg/dL (ref 0.50–1.10)
GFR calc non Af Amer: >90 mL/min (ref >90)
Glucose, Bld: 80 mg/dL (ref 70–99)
Potassium: 4.3 mEq/L (ref 3.7–5.3)
Sodium: 137 mEq/L (ref 137–147)
TOTAL PROTEIN: 5.5 g/dL — AB (ref 6.0–8.3)
Total Bilirubin: <0.2 mg/dL — ABNORMAL LOW (ref 0.3–1.2)

## 2014-07-03 LAB — ABO/RH: ABO/RH(D): A POS

## 2014-07-03 LAB — PREPARE RBC (CROSSMATCH)

## 2014-07-03 LAB — RPR

## 2014-07-03 SURGERY — Surgical Case
Anesthesia: Spinal | Site: Abdomen

## 2014-07-03 MED ORDER — MEPERIDINE HCL 25 MG/ML IJ SOLN
6.2500 mg | INTRAMUSCULAR | Status: DC | PRN
Start: 2014-07-03 — End: 2014-07-03

## 2014-07-03 MED ORDER — SENNOSIDES-DOCUSATE SODIUM 8.6-50 MG PO TABS
2.0000 | ORAL_TABLET | ORAL | Status: DC
  Administered 2014-07-03 – 2014-07-04 (×2): 2 via ORAL
  Filled 2014-07-03 (×2): qty 2

## 2014-07-03 MED ORDER — FENTANYL CITRATE 0.05 MG/ML IJ SOLN: 25.0000 ug | INTRAMUSCULAR | Status: DC | PRN

## 2014-07-03 MED ORDER — LACTATED RINGERS IV SOLN
INTRAVENOUS | Status: DC
  Administered 2014-07-03: 06:00:00 via INTRAVENOUS

## 2014-07-03 MED ORDER — LANOLIN HYDROUS EX OINT: 1.0000 "application " | TOPICAL_OINTMENT | CUTANEOUS | Status: DC | PRN

## 2014-07-03 MED ORDER — MENTHOL 3 MG MT LOZG: 1.0000 | LOZENGE | OROMUCOSAL | Status: DC | PRN

## 2014-07-03 MED ORDER — WITCH HAZEL-GLYCERIN EX PADS: 1.0000 "application " | MEDICATED_PAD | CUTANEOUS | Status: DC | PRN

## 2014-07-03 MED ORDER — CEFAZOLIN SODIUM-DEXTROSE 2-3 GM-% IV SOLR
INTRAVENOUS | Status: AC
  Filled 2014-07-03: qty 50

## 2014-07-03 MED ORDER — NALBUPHINE HCL 10 MG/ML IJ SOLN: 5.0000 mg | INTRAMUSCULAR | Status: DC | PRN

## 2014-07-03 MED ORDER — DIPHENHYDRAMINE HCL 25 MG PO CAPS: 25.0000 mg | ORAL_CAPSULE | Freq: Four times a day (QID) | ORAL | Status: DC | PRN

## 2014-07-03 MED ORDER — MORPHINE SULFATE 0.5 MG/ML IJ SOLN
INTRAMUSCULAR | Status: AC
  Filled 2014-07-03: qty 10

## 2014-07-03 MED ORDER — DIBUCAINE 1 % RE OINT: 1.0000 "application " | TOPICAL_OINTMENT | RECTAL | Status: DC | PRN

## 2014-07-03 MED ORDER — IBUPROFEN 600 MG PO TABS
600.0000 mg | ORAL_TABLET | Freq: Four times a day (QID) | ORAL | Status: DC
  Administered 2014-07-04: 600 mg via ORAL
  Filled 2014-07-03 (×5): qty 1

## 2014-07-03 MED ORDER — ONDANSETRON HCL 4 MG/2ML IJ SOLN: 4.0000 mg | Freq: Three times a day (TID) | INTRAMUSCULAR | Status: DC | PRN

## 2014-07-03 MED ORDER — OXYTOCIN 40 UNITS IN LACTATED RINGERS INFUSION - SIMPLE MED: 62.5000 mL/h | INTRAVENOUS | Status: AC

## 2014-07-03 MED ORDER — NALOXONE HCL 1 MG/ML IJ SOLN
1.0000 ug/kg/h | INTRAVENOUS | Status: DC | PRN
  Filled 2014-07-03: qty 2

## 2014-07-03 MED ORDER — NALOXONE HCL 0.4 MG/ML IJ SOLN: 0.4000 mg | INTRAMUSCULAR | Status: DC | PRN

## 2014-07-03 MED ORDER — SIMETHICONE 80 MG PO CHEW
80.0000 mg | CHEWABLE_TABLET | ORAL | Status: DC
  Administered 2014-07-03 – 2014-07-04 (×2): 80 mg via ORAL
  Filled 2014-07-03 (×2): qty 1

## 2014-07-03 MED ORDER — PHENYLEPHRINE HCL 10 MG/ML IJ SOLN
INTRAMUSCULAR | Status: AC
  Filled 2014-07-03: qty 1

## 2014-07-03 MED ORDER — DIPHENHYDRAMINE HCL 50 MG/ML IJ SOLN: 12.5000 mg | INTRAMUSCULAR | Status: DC | PRN

## 2014-07-03 MED ORDER — PRENATAL MULTIVITAMIN CH
1.0000 | ORAL_TABLET | Freq: Every day | ORAL | Status: DC
  Administered 2014-07-04 – 2014-07-05 (×2): 1 via ORAL
  Filled 2014-07-03 (×2): qty 1

## 2014-07-03 MED ORDER — SCOPOLAMINE 1 MG/3DAYS TD PT72
MEDICATED_PATCH | TRANSDERMAL | Status: AC
  Filled 2014-07-03: qty 1

## 2014-07-03 MED ORDER — SIMETHICONE 80 MG PO CHEW
80.0000 mg | CHEWABLE_TABLET | Freq: Three times a day (TID) | ORAL | Status: DC
  Administered 2014-07-04 – 2014-07-05 (×4): 80 mg via ORAL
  Filled 2014-07-03 (×5): qty 1

## 2014-07-03 MED ORDER — TETANUS-DIPHTH-ACELL PERTUSSIS 5-2.5-18.5 LF-MCG/0.5 IM SUSP
0.5000 mL | Freq: Once | INTRAMUSCULAR | Status: AC
  Administered 2014-07-05: 0.5 mL via INTRAMUSCULAR

## 2014-07-03 MED ORDER — CEFAZOLIN SODIUM-DEXTROSE 2-3 GM-% IV SOLR: 2.0000 g | INTRAVENOUS | Status: DC

## 2014-07-03 MED ORDER — ONDANSETRON HCL 4 MG/2ML IJ SOLN: 4.0000 mg | INTRAMUSCULAR | Status: DC | PRN

## 2014-07-03 MED ORDER — HYDROMORPHONE HCL PF 1 MG/ML IJ SOLN
1.0000 mg | INTRAMUSCULAR | Status: DC | PRN
  Administered 2014-07-03 (×2): 1 mg via INTRAVENOUS
  Filled 2014-07-03 (×2): qty 1

## 2014-07-03 MED ORDER — MEASLES, MUMPS & RUBELLA VAC ~~LOC~~ INJ
0.5000 mL | INJECTION | Freq: Once | SUBCUTANEOUS | Status: DC
  Filled 2014-07-03: qty 0.5

## 2014-07-03 MED ORDER — CITRIC ACID-SODIUM CITRATE 334-500 MG/5ML PO SOLN
30.0000 mL | Freq: Once | ORAL | Status: AC
  Administered 2014-07-03: 30 mL via ORAL
  Filled 2014-07-03: qty 15

## 2014-07-03 MED ORDER — ZOLPIDEM TARTRATE 5 MG PO TABS: 5.0000 mg | ORAL_TABLET | Freq: Every evening | ORAL | Status: DC | PRN

## 2014-07-03 MED ORDER — ONDANSETRON HCL 4 MG/2ML IJ SOLN
INTRAMUSCULAR | Status: DC | PRN
Start: 2014-07-03 — End: 2014-07-03
  Administered 2014-07-03: 4 mg via INTRAVENOUS

## 2014-07-03 MED ORDER — MEPERIDINE HCL 25 MG/ML IJ SOLN: 6.2500 mg | INTRAMUSCULAR | Status: DC | PRN

## 2014-07-03 MED ORDER — LACTATED RINGERS IV SOLN
INTRAVENOUS | Status: DC | PRN
  Administered 2014-07-03: 07:00:00 via INTRAVENOUS

## 2014-07-03 MED ORDER — ONDANSETRON HCL 4 MG PO TABS: 4.0000 mg | ORAL_TABLET | ORAL | Status: DC | PRN

## 2014-07-03 MED ORDER — BISACODYL 10 MG RE SUPP: 10.0000 mg | Freq: Every day | RECTAL | Status: DC | PRN

## 2014-07-03 MED ORDER — SIMETHICONE 80 MG PO CHEW: 80.0000 mg | CHEWABLE_TABLET | ORAL | Status: DC | PRN

## 2014-07-03 MED ORDER — ACETAMINOPHEN 160 MG/5ML PO SOLN: 975.0000 mg | Freq: Once | ORAL | Status: DC | PRN

## 2014-07-03 MED ORDER — OXYCODONE-ACETAMINOPHEN 5-325 MG PO TABS
1.0000 | ORAL_TABLET | ORAL | Status: DC | PRN
  Administered 2014-07-03 – 2014-07-05 (×5): 1 via ORAL
  Filled 2014-07-03 (×6): qty 1

## 2014-07-03 MED ORDER — METOCLOPRAMIDE HCL 5 MG/ML IJ SOLN: 10.0000 mg | Freq: Three times a day (TID) | INTRAMUSCULAR | Status: DC | PRN

## 2014-07-03 MED ORDER — ONDANSETRON HCL 4 MG/2ML IJ SOLN
INTRAMUSCULAR | Status: AC
  Filled 2014-07-03: qty 2

## 2014-07-03 MED ORDER — PHENYLEPHRINE 8 MG IN D5W 100 ML (0.08MG/ML) PREMIX OPTIME
INJECTION | INTRAVENOUS | Status: DC | PRN
  Administered 2014-07-03: 60 ug/min via INTRAVENOUS

## 2014-07-03 MED ORDER — EPHEDRINE 5 MG/ML INJ
INTRAVENOUS | Status: AC
  Filled 2014-07-03: qty 10

## 2014-07-03 MED ORDER — FLEET ENEMA 7-19 GM/118ML RE ENEM: 1.0000 | ENEMA | Freq: Every day | RECTAL | Status: DC | PRN

## 2014-07-03 MED ORDER — SODIUM CHLORIDE 0.9 % IJ SOLN: 3.0000 mL | INTRAMUSCULAR | Status: DC | PRN

## 2014-07-03 MED ORDER — PROMETHAZINE HCL 25 MG/ML IJ SOLN: 6.2500 mg | INTRAMUSCULAR | Status: DC | PRN

## 2014-07-03 MED ORDER — OXYTOCIN 10 UNIT/ML IJ SOLN
40.0000 [IU] | INTRAVENOUS | Status: DC | PRN
  Administered 2014-07-03: 40 [IU] via INTRAVENOUS

## 2014-07-03 MED ORDER — OXYCODONE-ACETAMINOPHEN 5-325 MG PO TABS: 2.0000 | ORAL_TABLET | ORAL | Status: DC | PRN

## 2014-07-03 MED ORDER — ACETAMINOPHEN 160 MG/5ML PO SOLN
975.0000 mg | Freq: Four times a day (QID) | ORAL | Status: DC | PRN
  Filled 2014-07-03: qty 40.6

## 2014-07-03 MED ORDER — OXYTOCIN 10 UNIT/ML IJ SOLN
INTRAMUSCULAR | Status: AC
  Filled 2014-07-03: qty 4

## 2014-07-03 MED ORDER — METHYLERGONOVINE MALEATE 0.2 MG/ML IJ SOLN: 0.2000 mg | INTRAMUSCULAR | Status: DC | PRN

## 2014-07-03 MED ORDER — FENTANYL CITRATE 0.05 MG/ML IJ SOLN
INTRAMUSCULAR | Status: DC | PRN
  Administered 2014-07-03: 25 ug via INTRAVENOUS

## 2014-07-03 MED ORDER — FENTANYL CITRATE 0.05 MG/ML IJ SOLN
INTRAMUSCULAR | Status: AC
  Filled 2014-07-03: qty 2

## 2014-07-03 MED ORDER — LACTATED RINGERS IV SOLN
INTRAVENOUS | Status: DC | PRN
  Administered 2014-07-03 (×3): via INTRAVENOUS

## 2014-07-03 MED ORDER — DIPHENHYDRAMINE HCL 25 MG PO CAPS
25.0000 mg | ORAL_CAPSULE | ORAL | Status: DC | PRN
  Filled 2014-07-03: qty 1

## 2014-07-03 MED ORDER — FERROUS SULFATE 325 (65 FE) MG PO TABS
325.0000 mg | ORAL_TABLET | Freq: Two times a day (BID) | ORAL | Status: DC
  Administered 2014-07-04 – 2014-07-05 (×3): 325 mg via ORAL
  Filled 2014-07-03 (×3): qty 1

## 2014-07-03 MED ORDER — DIPHENHYDRAMINE HCL 50 MG/ML IJ SOLN: 25.0000 mg | INTRAMUSCULAR | Status: DC | PRN

## 2014-07-03 MED ORDER — MORPHINE SULFATE (PF) 0.5 MG/ML IJ SOLN
INTRAMUSCULAR | Status: DC | PRN
  Administered 2014-07-03: .1 mg via INTRATHECAL

## 2014-07-03 MED ORDER — LACTATED RINGERS IV SOLN
INTRAVENOUS | Status: DC
  Administered 2014-07-03: 999 mL via INTRAVENOUS

## 2014-07-03 MED ORDER — CEFAZOLIN SODIUM-DEXTROSE 2-3 GM-% IV SOLR
INTRAVENOUS | Status: DC | PRN
  Administered 2014-07-03: 2 g via INTRAVENOUS

## 2014-07-03 MED ORDER — METHYLERGONOVINE MALEATE 0.2 MG PO TABS: 0.2000 mg | ORAL_TABLET | ORAL | Status: DC | PRN

## 2014-07-03 MED ORDER — SCOPOLAMINE 1 MG/3DAYS TD PT72
1.0000 | MEDICATED_PATCH | Freq: Once | TRANSDERMAL | Status: DC
  Administered 2014-07-03: 1.5 mg via TRANSDERMAL

## 2014-07-03 MED ORDER — NALBUPHINE HCL 10 MG/ML IJ SOLN
5.0000 mg | INTRAMUSCULAR | Status: DC | PRN
  Administered 2014-07-03: 10 mg via INTRAVENOUS
  Filled 2014-07-03: qty 1

## 2014-07-03 SURGICAL SUPPLY — 37 items
APL SKNCLS STERI-STRIP NONHPOA (GAUZE/BANDAGES/DRESSINGS) ×1
BENZOIN TINCTURE PRP APPL 2/3 (GAUZE/BANDAGES/DRESSINGS) ×2 IMPLANT
BLADE SURG 10 STRL SS (BLADE) ×4 IMPLANT
CLAMP CORD UMBIL (MISCELLANEOUS) IMPLANT
CLOTH BEACON ORANGE TIMEOUT ST (SAFETY) ×2 IMPLANT
DRAPE LG THREE QUARTER DISP (DRAPES) IMPLANT
DRSG OPSITE POSTOP 4X10 (GAUZE/BANDAGES/DRESSINGS) ×2 IMPLANT
DURAPREP 26ML APPLICATOR (WOUND CARE) ×2 IMPLANT
ELECT REM PT RETURN 9FT ADLT (ELECTROSURGICAL) ×2
ELECTRODE REM PT RTRN 9FT ADLT (ELECTROSURGICAL) ×1 IMPLANT
EXTRACTOR VACUUM BELL STYLE (SUCTIONS) IMPLANT
GLOVE BIO SURGEON STRL SZ7 (GLOVE) ×2 IMPLANT
GOWN STRL REUS W/TWL LRG LVL3 (GOWN DISPOSABLE) ×4 IMPLANT
KIT ABG SYR 3ML LUER SLIP (SYRINGE) IMPLANT
NDL HYPO 25X5/8 SAFETYGLIDE (NEEDLE) IMPLANT
NEEDLE HYPO 25X5/8 SAFETYGLIDE (NEEDLE) IMPLANT
NS IRRIG 1000ML POUR BTL (IV SOLUTION) ×2 IMPLANT
PACK C SECTION WH (CUSTOM PROCEDURE TRAY) ×2 IMPLANT
PAD OB MATERNITY 4.3X12.25 (PERSONAL CARE ITEMS) ×2 IMPLANT
RETRACTOR WND ALEXIS 25 LRG (MISCELLANEOUS) ×1 IMPLANT
RTRCTR WOUND ALEXIS 25CM LRG (MISCELLANEOUS) ×2
STAPLER VISISTAT 35W (STAPLE) IMPLANT
STRIP CLOSURE SKIN 1/2X4 (GAUZE/BANDAGES/DRESSINGS) ×2 IMPLANT
SUT MNCRL 0 VIOLET CTX 36 (SUTURE) ×2 IMPLANT
SUT MONOCRYL 0 CTX 36 (SUTURE) ×4
SUT PDS AB 0 CTX 60 (SUTURE) IMPLANT
SUT PLAIN 2 0 (SUTURE) ×2
SUT PLAIN 2 0 XLH (SUTURE) IMPLANT
SUT PLAIN ABS 2-0 CT1 27XMFL (SUTURE) IMPLANT
SUT VIC AB 0 CT1 27 (SUTURE) ×4
SUT VIC AB 0 CT1 27XBRD ANBCTR (SUTURE) ×2 IMPLANT
SUT VIC AB 2-0 CT1 27 (SUTURE) ×2
SUT VIC AB 2-0 CT1 TAPERPNT 27 (SUTURE) ×1 IMPLANT
SUT VIC AB 4-0 KS 27 (SUTURE) ×2 IMPLANT
TOWEL OR 17X24 6PK STRL BLUE (TOWEL DISPOSABLE) ×2 IMPLANT
TRAY FOLEY CATH 14FR (SET/KITS/TRAYS/PACK) ×2 IMPLANT
WATER STERILE IRR 1000ML POUR (IV SOLUTION) ×2 IMPLANT

## 2014-07-03 NOTE — Addendum Note (Signed)
Addendum created 07/03/14 0915 by Billie Lade, CRNA   Modules edited: Anesthesia Medication Administration

## 2014-07-03 NOTE — Brief Op Note (Signed)
07/03/2014  7:43 AM  PATIENT:  Olivia Vance  33 y.o. female  PRE-OPERATIVE DIAGNOSIS:  cesection for breech presentation and low platelet count  POST-OPERATIVE DIAGNOSIS:  cesection for breech presentation and low platelet count  PROCEDURE:  Procedure(s): CESAREAN SECTION (N/A)  SURGEON:  Surgeon(s) and Role:    * Daria Pastures, MD - Primary   ANESTHESIA:   spinal  EBL:  Total I/O In: -  Out: 1000 [Urine:300; Blood:700]  BLOOD ADMINISTERED:none  SPECIMEN:  No Specimen  DISPOSITION OF SPECIMEN:  N/A  COUNTS:  YES  TOURNIQUET:  * No tourniquets in log *  DICTATION: .Note written in EPIC  PLAN OF CARE: Admit to inpatient   PATIENT DISPOSITION:  PACU - hemodynamically stable.   Delay start of Pharmacological VTE agent (>24hrs) due to surgical blood loss or risk of bleeding: not applicable  Complications:  none Medications:  Ancef, Pitocin Findings:  Baby female, Apgars 9,9, weight P.   Normal tubes, ovaries and uterus seen.  Baby was skin to skin with mother after birth in the OR.  Technique:  After adequate spinal anesthesia was achieved, the patient was prepped and draped in usual sterile fashion.  A foley catheter was used to drain the bladder.  A pfannanstiel incision was made with the scalpel and carried down to the fascia with the bovie cautery. The fascia was incised in the midline with the scalpel and carried in a transverse curvilinear manner bilaterally.  The fascia was reflected superiorly and inferiorly off the rectus muscles and the muscles split in the midline.  A bowel free portion of the peritoneum was entered bluntly and then extended in a superior and inferior manner with good visualization of the bowel and bladder.  The Alexis instrument was then placed and the vesico-uterine fascia tented up and incised in a transverse curvilinear manner.  A 2 cm transverse incision was made in the upper portion of the lower uterine segment until the amnion was  exposed.   The incision was extended transversely in a blunt manner.  Clear fluid was noted and the baby delivered in the frank breech presentation without complication.  The baby was bulb suctioned and the cord was clamped and cut.  The baby was then handed to awaiting Neonatology.  The placenta was then delivered manually and the uterus cleared of all debris.  The uterine incision was then closed with a running lock stitch of 0 monocryl.  An imbricating layer of 0 monocryl was closed as well. A small branch of uterine artery was bleeding and required 4 figure of eight stitches were done to stop bleeding.  Once excellent hemostasis of the uterine incision was achieved and the abdomen was cleared with irrigation.  The peritoneum was closed with a running stitch of 2-0 vicryl.  This incorporated the rectus muscles as a separate layer.  The fascia was then closed with a running stitch of 0 vicryl.  The subcutaneous layer was closed with interrupted  stitches of 2-0 plain gut.  The skin was closed with 4-0 vicryl on a Keith needle and steri-strips.  The patient tolerated the procedure well and was returned to the recovery room in stable condition.  All counts were correct times three.  Megumi Treaster A

## 2014-07-03 NOTE — MAU Note (Signed)
Pt here for C/S, breech presentation

## 2014-07-03 NOTE — Lactation Note (Signed)
This note was copied from the chart of Olivia Vance. Lactation Consultation Note  Patient Name: Olivia Vance FXJOI'[id number] Date: 07/03/2014 Reason for consult: Initial assessment Baby 13 hours of life. Mom reports baby nursing well and has been able to latch deeply. Mom has a room full of family, enc her to call for assistance with latch as needed. Mom reports that she had mastitis twice within the first 3 weeks with her first child. Enc mom to use expressed colostrum/breastmilk after each time she nurses and rub over nipples and allow to dry before covering up. Mom asked for lanolin, explained colostrum a better choice. Also discussed the need to maintain a good deep latch to avoid any nipple damage. Mom given [facility] brochure, aware of OP/BFSG and community resources. Enc mom to offer lots of STS, feed with cues and at least 8-12 times/24 hours.   Maternal Data Has patient been taught Hand Expression?: Yes Does the patient have breastfeeding experience prior to this delivery?: Yes  Feeding    LATCH Score/Interventions Latch:  (Mom states baby just finished nursing.)                    Lactation Tools Discussed/Used     Consult Status Consult Status: Follow-up Date: 07/04/14 Follow-up type: In-patient    Inocente Salles 07/03/2014, 8:32 PM

## 2014-07-03 NOTE — Transfer of Care (Addendum)
Immediate Anesthesia Transfer of Care Note  Patient: Olivia Vance  Procedure(s) Performed: Procedure(s): CESAREAN SECTION (N/A)  Patient Location: PACU  Anesthesia Type:Spinal  Level of Consciousness: awake, alert  and oriented  Airway & Oxygen Therapy: Patient Spontanous Breathing  Post-op Assessment: Report given to PACU RN and Post -op Vital signs reviewed and stable  Post vital signs: Reviewed and stable  Complications: No apparent anesthesia complications

## 2014-07-03 NOTE — Anesthesia Postprocedure Evaluation (Signed)
  Anesthesia Post Note  Patient: Olivia Vance  Procedure(s) Performed: Procedure(s) (LRB): CESAREAN SECTION (N/A)  Anesthesia type: Spinal  Patient location: PACU  Post pain: Pain level controlled  Post assessment: Post-op Vital signs reviewed  Last Vitals:  Filed Vitals:   07/03/14 0900  BP: 117/75  Pulse: 73  Temp:   Resp: 17    Post vital signs: Reviewed  Level of consciousness: awake  Complications: No apparent anesthesia complications

## 2014-07-03 NOTE — Addendum Note (Signed)
Addendum created 07/03/14 1726 by Genevie Ann, CRNA   Modules edited: Notes Section   Notes Section:  File: [id number]

## 2014-07-03 NOTE — Anesthesia Postprocedure Evaluation (Signed)
  Anesthesia Post-op Note  Patient: Olivia Vance  Procedure(s) Performed: Procedure(s): CESAREAN SECTION (N/A)  Patient Location: PACU and Mother/Baby  Anesthesia Type:General  Level of Consciousness: awake, alert  and oriented  Airway and Oxygen Therapy: Patient Spontanous Breathing  Post-op Pain: mild  Post-op Assessment: Post-op Vital signs reviewed  Post-op Vital Signs: Reviewed and stable  Last Vitals:  Filed Vitals:   07/03/14 1330  BP: 115/78  Pulse: 82  Temp: 36.8 C  Resp: 18    Complications: No apparent anesthesia complications

## 2014-07-03 NOTE — MAU Note (Signed)
Pt to MAU for scheduled C/S with Dr Philis Pique

## 2014-07-03 NOTE — Anesthesia Preprocedure Evaluation (Addendum)
Anesthesia Evaluation  Patient identified by MRN, date of birth, ID band Patient awake    Reviewed: Allergy & Precautions, H&P , Patient's Chart, lab work & pertinent test results  Airway Mallampati: II TM Distance: >3 FB Neck ROM: full    Dental no notable dental hx.    Pulmonary former smoker,  breath sounds clear to auscultation  Pulmonary exam normal       Cardiovascular Exercise Tolerance: Good Rhythm:regular Rate:Normal     Neuro/Psych    GI/Hepatic   Endo/Other    Renal/GU      Musculoskeletal   Abdominal   Peds  Hematology   Anesthesia Other Findings   Reproductive/Obstetrics                          Anesthesia Physical Anesthesia Plan  ASA: II  Anesthesia Plan: Spinal   Post-op Pain Management:    Induction:   Airway Management Planned:   Additional Equipment:   Intra-op Plan:   Post-operative Plan:   Informed Consent: I have reviewed the patients History and Physical, chart, labs and discussed the procedure including the risks, benefits and alternatives for the proposed anesthesia with the patient or authorized representative who has indicated his/her understanding and acceptance.   Dental Advisory Given  Plan Discussed with: CRNA  Anesthesia Plan Comments: (Lab work confirmed with CRNA in room. Platelets 81.  Discussed spinal anesthetic, and patient consents to the procedure:  included risk of possible headache,backache, failed block, allergic reaction, and increased risk of nerve injury. GA offered as alternative.  This patient was asked if she had any questions or concerns before the procedure started. She opts for SAB. Discussed calling for availability of Platelets in blood bank with Dr Philis Pique. She wishes to proceed now. )       Anesthesia Quick Evaluation

## 2014-07-03 NOTE — Op Note (Signed)
07/03/2014  7:43 AM  PATIENT:  Olivia Vance  33 y.o. female  PRE-OPERATIVE DIAGNOSIS:  cesection for breech presentation and low platelet count  POST-OPERATIVE DIAGNOSIS:  cesection for breech presentation and low platelet count  PROCEDURE:  Procedure(s): CESAREAN SECTION (N/A)  SURGEON:  Surgeon(s) and Role:    * Daria Pastures, MD - Primary   ANESTHESIA:   spinal  EBL:  Total I/O In: -  Out: 1000 [Urine:300; Blood:700]  BLOOD ADMINISTERED:none  SPECIMEN:  No Specimen  DISPOSITION OF SPECIMEN:  N/A  COUNTS:  YES  TOURNIQUET:  * No tourniquets in log *  DICTATION: .Note written in EPIC  PLAN OF CARE: Admit to inpatient   PATIENT DISPOSITION:  PACU - hemodynamically stable.   Delay start of Pharmacological VTE agent (>24hrs) due to surgical blood loss or risk of bleeding: not applicable  Complications:  none Medications:  Ancef, Pitocin Findings:  Baby female, Apgars 9,9, weight P.   Normal tubes, ovaries and uterus seen.  Baby was skin to skin with mother after birth in the OR.  Technique:  After adequate spinal anesthesia was achieved, the patient was prepped and draped in usual sterile fashion.  A foley catheter was used to drain the bladder.  A pfannanstiel incision was made with the scalpel and carried down to the fascia with the bovie cautery. The fascia was incised in the midline with the scalpel and carried in a transverse curvilinear manner bilaterally.  The fascia was reflected superiorly and inferiorly off the rectus muscles and the muscles split in the midline.  A bowel free portion of the peritoneum was entered bluntly and then extended in a superior and inferior manner with good visualization of the bowel and bladder.  The Alexis instrument was then placed and the vesico-uterine fascia tented up and incised in a transverse curvilinear manner.  A 2 cm transverse incision was made in the upper portion of the lower uterine segment until the amnion was  exposed.   The incision was extended transversely in a blunt manner.  Clear fluid was noted and the baby delivered in the frank breech presentation without complication.  The baby was bulb suctioned and the cord was clamped and cut.  The baby was then handed to awaiting Neonatology.  The placenta was then delivered manually and the uterus cleared of all debris.  The uterine incision was then closed with a running lock stitch of 0 monocryl.  An imbricating layer of 0 monocryl was closed as well. A small branch of uterine artery was bleeding and required 4 figure of eight stitches were done to stop bleeding.  Once excellent hemostasis of the uterine incision was achieved and the abdomen was cleared with irrigation.  The peritoneum was closed with a running stitch of 2-0 vicryl.  This incorporated the rectus muscles as a separate layer.  The fascia was then closed with a running stitch of 0 vicryl.  The subcutaneous layer was closed with interrupted  stitches of 2-0 plain gut.  The skin was closed with 4-0 vicryl on a Keith needle and steri-strips.  The patient tolerated the procedure well and was returned to the recovery room in stable condition.  All counts were correct times three.  Khamil Lamica A

## 2014-07-03 NOTE — Anesthesia Procedure Notes (Signed)

## 2014-07-03 NOTE — Consult Note (Signed)
Neonatology Note:   Attendance at C-section:    I was asked by Dr. Philis Pique to attend this primary C/S at term due to frank breech presentation and thrombocytopenia (plt count 94K). The mother is a G2P1 A pos, GBS done (not found on chart) with Celiac disease. ROM at delivery, fluid clear. Infant vigorous with good spontaneous cry and tone. Needed only minimal bulb suctioning. Ap 9/9. Lungs clear to ausc in DR. To CN to care of Pediatrician.   Real Cons, MD

## 2014-07-03 NOTE — Progress Notes (Signed)
There has been no change in the patients history, status or exam since the history and physical.  Filed Vitals:   07/03/14 0443  BP: 121/84  Pulse: 84  Resp: 18  Height: 5\' 5"  (1.651 m)  Weight: 72.122 kg (159 lb)  SpO2: 100%    Lab Results  Component Value Date   WBC 8.9 07/03/2014   HGB 11.2* 07/03/2014   HCT 34.4* 07/03/2014   MCV 81.5 07/03/2014   PLT 81* 07/03/2014    Olivia Vance A

## 2014-07-04 LAB — CBC
HEMATOCRIT: 29.9 % — AB (ref 36.0–46.0)
Hemoglobin: 9.5 g/dL — ABNORMAL LOW (ref 12.0–15.0)
MCH: 26.5 pg (ref 26.0–34.0)
MCHC: 31.8 g/dL (ref 30.0–36.0)
MCV: 83.3 fL (ref 78.0–100.0)
Platelets: 76 10*3/uL — ABNORMAL LOW (ref 150–400)
RBC: 3.59 MIL/uL — AB (ref 3.87–5.11)
RDW: 23.1 % — ABNORMAL HIGH (ref 11.5–15.5)
WBC: 8.5 10*3/uL (ref 4.0–10.5)

## 2014-07-04 NOTE — Progress Notes (Signed)
Patient is eating, ambulating, voiding.  Pain control is good.  Appropriate lochia.  No complaints.  Filed Vitals:   07/03/14 1830 07/03/14 2030 07/04/14 0030 07/04/14 0430  BP: 118/78 126/74 120/78 112/74  Pulse: 79 88 88 79  Temp: 98.1 F (36.7 C) 99.2 F (37.3 C) 98.4 F (36.9 C) 98.6 F (37 C)  TempSrc: Oral Oral Oral Oral  Resp: 18 16 16 16   Height:      Weight:      SpO2: 98% 99% 99% 99%    Fundus firm Perineum without swelling. Inc: c/d/i Ext: no CT Lab Results  Component Value Date   WBC 8.5 07/04/2014   HGB 9.5* 07/04/2014   HCT 29.9* 07/04/2014   MCV 83.3 07/04/2014   PLT 76* 07/04/2014    --/--/A POS, A POS (09/04 0515)  A/P Post op day #1 s/p repeat c/s Anemia: Hb 9.5 - cont. PNV with Fe Thrombocytopenia - slight decrease, will recheck tomorrow.  Routine care.    Olivia Vance

## 2014-07-05 LAB — CBC
HEMATOCRIT: 29.2 % — AB (ref 36.0–46.0)
HEMOGLOBIN: 9.2 g/dL — AB (ref 12.0–15.0)
MCH: 26.2 pg (ref 26.0–34.0)
MCHC: 31.5 g/dL (ref 30.0–36.0)
MCV: 83.2 fL (ref 78.0–100.0)
Platelets: 93 10*3/uL — ABNORMAL LOW (ref 150–400)
RBC: 3.51 MIL/uL — AB (ref 3.87–5.11)
RDW: 23 % — ABNORMAL HIGH (ref 11.5–15.5)
WBC: 6.8 10*3/uL (ref 4.0–10.5)

## 2014-07-05 MED ORDER — OXYCODONE-ACETAMINOPHEN 5-325 MG PO TABS: 1.0000 | ORAL_TABLET | ORAL | Status: DC | PRN

## 2014-07-05 NOTE — Discharge Summary (Signed)
Obstetric Discharge Summary Reason for Admission: cesarean section Prenatal Procedures: ultrasound Intrapartum Procedures: cesarean: low cervical, transverse Postpartum Procedures: none Complications-Operative and Postpartum: none Hemoglobin  Date Value Ref Range Status  07/05/2014 9.2* 12.0 - 15.0 g/dL Final     HCT  Date Value Ref Range Status  07/05/2014 29.2* 36.0 - 46.0 % Final  07/20/2011 35   Final    Physical Exam:  General: alert and cooperative Lochia: appropriate Uterine Fundus: firm Incision: healing well, no significant drainage, no significant erythema DVT Evaluation: No evidence of DVT seen on physical exam.  Discharge Diagnoses: Term Pregnancy-delivered  Discharge Information: Date: 07/05/2014 Activity: pelvic rest Diet: routine Medications: PNV, Ibuprofen, Colace, Iron and Percocet Condition: stable Instructions: refer to practice specific booklet Discharge to: home Follow-up Information   Follow up with HORVATH,MICHELLE A, MD In 2 weeks.   Specialty:  Obstetrics and Gynecology   Contact information:   [place] [place] [address] [phone]       Newborn Data: Live born female  Birth Weight: 7 lb 12.5 oz (3530 g) APGAR: 9, 9  Home with mother.  Allyn Kenner 07/05/2014, 9:13 AM

## 2014-07-05 NOTE — Progress Notes (Signed)
Patient is eating, ambulating, voiding.  Pain control is good.  Appropriate lochia.  No complaints.  Filed Vitals:   07/04/14 0430 07/04/14 0830 07/04/14 1809 07/05/14 0600  BP: 112/74 123/79 106/68 111/64  Pulse: 79 80 98 83  Temp: 98.6 F (37 C) 98.9 F (37.2 C) 97.8 F (36.6 C) 98.4 F (36.9 C)  TempSrc: Oral Oral Oral   Resp: 16 18 18 18   Height:      Weight:      SpO2: 99% 98%      Fundus firm Perineum without swelling. Inc: c/d/i Ext: no CT  Lab Results  Component Value Date   WBC 6.8 07/05/2014   HGB 9.2* 07/05/2014   HCT 29.2* 07/05/2014   MCV 83.2 07/05/2014   PLT 93* 07/05/2014    --/--/A POS, A POS (09/04 0515)  A/P Post op day #2 s/p repeat c/s. Thrombocytopenia - plt improving from 76 yesterday, 93 today.  Routine care.    Allyn Kenner

## 2014-07-06 ENCOUNTER — Encounter (HOSPITAL_COMMUNITY): Payer: Self-pay | Admitting: Obstetrics and Gynecology

## 2014-07-06 LAB — TYPE AND SCREEN
ABO/RH(D): A POS
ANTIBODY SCREEN: NEGATIVE
UNIT DIVISION: 0
Unit division: 0

## 2014-08-31 ENCOUNTER — Encounter (HOSPITAL_COMMUNITY): Payer: Self-pay | Admitting: Obstetrics and Gynecology

## 2015-01-20 ENCOUNTER — Telehealth: Payer: Self-pay | Admitting: Family Medicine

## 2015-01-20 MED ORDER — HYDROCORTISONE ACETATE 25 MG RE SUPP: 25.0000 mg | Freq: Two times a day (BID) | RECTAL | Status: DC

## 2015-01-20 NOTE — Telephone Encounter (Signed)
anusol hc supp sent to pharmacy

## 2015-01-20 NOTE — Telephone Encounter (Signed)
Patient aware.

## 2015-04-26 ENCOUNTER — Ambulatory Visit (INDEPENDENT_AMBULATORY_CARE_PROVIDER_SITE_OTHER): Payer: BC Managed Care – PPO | Admitting: Physician Assistant

## 2015-04-26 ENCOUNTER — Encounter (INDEPENDENT_AMBULATORY_CARE_PROVIDER_SITE_OTHER): Payer: Self-pay

## 2015-04-26 ENCOUNTER — Encounter: Payer: Self-pay | Admitting: Physician Assistant

## 2015-04-26 VITALS — BP 105/65 | HR 95 | Temp 97.2°F | Ht 65.0 in | Wt 124.0 lb

## 2015-04-26 DIAGNOSIS — J0110 Acute frontal sinusitis, unspecified: Secondary | ICD-10-CM

## 2015-04-26 DIAGNOSIS — R062 Wheezing: Secondary | ICD-10-CM

## 2015-04-26 MED ORDER — FLUTICASONE PROPIONATE 50 MCG/ACT NA SUSP: 2.0000 | Freq: Every day | NASAL | Status: DC

## 2015-04-26 MED ORDER — ALBUTEROL SULFATE HFA 108 (90 BASE) MCG/ACT IN AERS: 2.0000 | INHALATION_SPRAY | Freq: Four times a day (QID) | RESPIRATORY_TRACT | Status: DC | PRN

## 2015-04-26 MED ORDER — FLUCONAZOLE 150 MG PO TABS: ORAL_TABLET | ORAL | Status: DC

## 2015-04-26 MED ORDER — AMOXICILLIN 875 MG PO TABS: 875.0000 mg | ORAL_TABLET | Freq: Two times a day (BID) | ORAL | Status: DC

## 2015-04-26 NOTE — Progress Notes (Signed)
   Subjective:    Patient ID: Olivia Vance, female    DOB: October 03, 1981, 34 y.o.   MRN: [id number]  HPI 34 y/o female   Musinex DM     Review of Systems  HENT: Positive for congestion (nasal ), ear pain and sinus pressure.   Respiratory: Positive for cough (dry nonproductive ).   Neurological: Positive for headaches.       Objective:   Physical Exam        Assessment & Plan:  1. Acute frontal sinusitis, recurrence not specified - otc plain musinex  - netti pot  - amoxicillin (AMOXIL) 875 MG tablet; Take 1 tablet (875 mg total) by mouth 2 (two) times daily.  Dispense: 20 tablet; Refill: 0 - fluticasone (FLONASE) 50 MCG/ACT nasal spray; Place 2 sprays into both nostrils daily.  Dispense: 16 g; Refill: 6  2. Wheeze - otc plain musinex  - albuterol (PROVENTIL HFA;VENTOLIN HFA) 108 (90 BASE) MCG/ACT inhaler; Inhale 2 puffs into the lungs every 6 (six) hours as needed for wheezing or shortness of breath.  Dispense: 1 Inhaler; Refill: 0   RTO prn   Adama Ferber A. Benjamin Stain PA-C

## 2015-04-26 NOTE — Patient Instructions (Signed)
Over the counter plain musinex    Sinusitis Sinusitis is redness, soreness, and inflammation of the paranasal sinuses. Paranasal sinuses are air pockets within the bones of your face (beneath the eyes, the middle of the forehead, or above the eyes). In healthy paranasal sinuses, mucus is able to drain out, and air is able to circulate through them by way of your nose. However, when your paranasal sinuses are inflamed, mucus and air can become trapped. This can allow bacteria and other germs to grow and cause infection. Sinusitis can develop quickly and last only a short time (acute) or continue over a long period (chronic). Sinusitis that lasts for more than 12 weeks is considered chronic.  CAUSES  Causes of sinusitis include:  Allergies.  Structural abnormalities, such as displacement of the cartilage that separates your nostrils (deviated septum), which can decrease the air flow through your nose and sinuses and affect sinus drainage.  Functional abnormalities, such as when the small hairs (cilia) that line your sinuses and help remove mucus do not work properly or are not present. SIGNS AND SYMPTOMS  Symptoms of acute and chronic sinusitis are the same. The primary symptoms are pain and pressure around the affected sinuses. Other symptoms include:  Upper toothache.  Earache.  Headache.  Bad breath.  Decreased sense of smell and taste.  A cough, which worsens when you are lying flat.  Fatigue.  Fever.  Thick drainage from your nose, which often is green and may contain pus (purulent).  Swelling and warmth over the affected sinuses. DIAGNOSIS  Your health care provider will perform a physical exam. During the exam, your health care provider may:  Look in your nose for signs of abnormal growths in your nostrils (nasal polyps).  Tap over the affected sinus to check for signs of infection.  View the inside of your sinuses (endoscopy) using an imaging device that has a light  attached (endoscope). If your health care provider suspects that you have chronic sinusitis, one or more of the following tests may be recommended:  Allergy tests.  Nasal culture. A sample of mucus is taken from your nose, sent to a lab, and screened for bacteria.  Nasal cytology. A sample of mucus is taken from your nose and examined by your health care provider to determine if your sinusitis is related to an allergy. TREATMENT  Most cases of acute sinusitis are related to a viral infection and will resolve on their own within 10 days. Sometimes medicines are prescribed to help relieve symptoms (pain medicine, decongestants, nasal steroid sprays, or saline sprays).  However, for sinusitis related to a bacterial infection, your health care provider will prescribe antibiotic medicines. These are medicines that will help kill the bacteria causing the infection.  Rarely, sinusitis is caused by a fungal infection. In theses cases, your health care provider will prescribe antifungal medicine. For some cases of chronic sinusitis, surgery is needed. Generally, these are cases in which sinusitis recurs more than 3 times per year, despite other treatments. HOME CARE INSTRUCTIONS   Drink plenty of water. Water helps thin the mucus so your sinuses can drain more easily.  Use a humidifier.  Inhale steam 3 to 4 times a day (for example, sit in the bathroom with the shower running).  Apply a warm, moist washcloth to your face 3 to 4 times a day, or as directed by your health care provider.  Use saline nasal sprays to help moisten and clean your sinuses.  Take medicines only  as directed by your health care provider.  If you were prescribed either an antibiotic or antifungal medicine, finish it all even if you start to feel better. SEEK IMMEDIATE MEDICAL CARE IF:  You have increasing pain or severe headaches.  You have nausea, vomiting, or drowsiness.  You have swelling around your face.  You  have vision problems.  You have a stiff neck.  You have difficulty breathing. MAKE SURE YOU:   Understand these instructions.  Will watch your condition.  Will get help right away if you are not doing well or get worse. Document Released: 10/16/2005 Document Revised: 03/02/2014 Document Reviewed: 10/31/2011 [facility] Patient Information 2015 [employer], [place]. This information is not intended to replace advice given to you by your health care provider. Make sure you discuss any questions you have with your health care provider.

## 2015-04-27 ENCOUNTER — Other Ambulatory Visit: Payer: Self-pay | Admitting: Physician Assistant

## 2015-04-27 MED ORDER — HYDROCODONE-HOMATROPINE 5-1.5 MG/5ML PO SYRP: 5.0000 mL | ORAL_SOLUTION | Freq: Three times a day (TID) | ORAL | Status: DC | PRN

## 2015-07-28 NOTE — Addendum Note (Signed)
Addended by: Marline Backbone A on: 07/28/2015 12:15 PM   Modules accepted: Level of Service

## 2015-08-23 ENCOUNTER — Ambulatory Visit: Payer: BC Managed Care – PPO | Admitting: Family Medicine

## 2015-11-03 ENCOUNTER — Encounter: Payer: Self-pay | Admitting: Family Medicine

## 2015-11-03 ENCOUNTER — Ambulatory Visit (INDEPENDENT_AMBULATORY_CARE_PROVIDER_SITE_OTHER): Payer: BC Managed Care – PPO | Admitting: Family Medicine

## 2015-11-03 VITALS — BP 111/76 | HR 77 | Temp 97.9°F | Ht 65.0 in | Wt 122.8 lb

## 2015-11-03 DIAGNOSIS — D649 Anemia, unspecified: Secondary | ICD-10-CM | POA: Diagnosis not present

## 2015-11-03 DIAGNOSIS — K900 Celiac disease: Secondary | ICD-10-CM

## 2015-11-03 DIAGNOSIS — E538 Deficiency of other specified B group vitamins: Secondary | ICD-10-CM | POA: Diagnosis not present

## 2015-11-03 NOTE — Progress Notes (Signed)
BP 111/76 mmHg  Pulse 77  Temp(Src) 97.9 F (36.6 C) (Oral)  Ht _0  (1.651 m)  Wt 122 lb 12.8 oz (55.702 kg)  BMI 20.44 kg/m2  LMP 10/23/2015   Subjective:    Patient ID: Olivia Vance, female    DOB: Jun 28, 1981, 35 y.o.   MRN: [id number]  HPI: Olivia Vance is a 35 y.o. female presenting on 11/03/2015 for Labwork   HPI Celiac disease and B12 deficiency and anemia Patient has known celiac disease for the past 10 years but recently has been off the diet for about 3 or 4 months. She said initially she did get a second but then slowly built up in her system over the past couple months and now she has been back on the diet and following appropriately for the past 3 weeks. She denies any abdominal pain or problems today. She denies any diarrhea or constipation. She denies any nausea or vomiting. She does generally just feel weak and fatigued and lack of energy. She also feels like she's had some hair changes.  Relevant past medical, surgical, family and social history reviewed and updated as indicated. Interim medical history since our last visit reviewed. Allergies and medications reviewed and updated.  Review of Systems  Constitutional: Positive for fatigue. Negative for fever and chills.  HENT: Negative for congestion, ear discharge and ear pain.   Eyes: Negative for redness and visual disturbance.  Respiratory: Negative for chest tightness and shortness of breath.   Cardiovascular: Negative for chest pain, palpitations and leg swelling.  Gastrointestinal: Negative for nausea, vomiting, abdominal pain, diarrhea and constipation.  Endocrine: Negative for cold intolerance and heat intolerance.  Genitourinary: Negative for dysuria and difficulty urinating.  Musculoskeletal: Negative for back pain, arthralgias and gait problem.  Skin: Negative for rash.  Neurological: Negative for dizziness, light-headedness and headaches.  Psychiatric/Behavioral: Negative for behavioral problems  and agitation.  All other systems reviewed and are negative.   Per HPI unless specifically indicated above     Medication List       This list is accurate as of: 11/03/15  4:40 PM.  Always use your most recent med list.               escitalopram 20 MG tablet  Commonly known as:  LEXAPRO  Take 20 mg by mouth daily.           Objective:    BP 111/76 mmHg  Pulse 77  Temp(Src) 97.9 F (36.6 C) (Oral)  Ht _1  (1.651 m)  Wt 122 lb 12.8 oz (55.702 kg)  BMI 20.44 kg/m2  LMP 10/23/2015  Wt Readings from Last 3 Encounters:  11/03/15 122 lb 12.8 oz (55.702 kg)  04/26/15 124 lb (56.246 kg)  07/03/14 159 lb (72.122 kg)    Physical Exam  Constitutional: She is oriented to person, place, and time. She appears well-developed and well-nourished. No distress.  Eyes: Conjunctivae and EOM are normal. Pupils are equal, round, and reactive to light.  Neck: Neck supple. No thyromegaly present.  Cardiovascular: Normal rate, regular rhythm, normal heart sounds and intact distal pulses.   No murmur heard. Pulmonary/Chest: Effort normal and breath sounds normal. No respiratory distress. She has no wheezes.  Abdominal: Soft. Bowel sounds are normal. She exhibits no distension and no mass. There is no tenderness. There is no rebound.  Musculoskeletal: Normal range of motion. She exhibits no edema or tenderness.  Lymphadenopathy:    She has no cervical adenopathy.  Neurological: She is alert and oriented to person, place, and time. Coordination normal.  Skin: Skin is warm and dry. No rash noted. She is not diaphoretic.  Psychiatric: She has a normal mood and affect. Her behavior is normal.  Nursing note and vitals reviewed.       Assessment & Plan:   Problem List Items Addressed This Visit      Digestive   Celiac disease - Primary   Relevant Orders   BMP8+EGFR   CBC with Differential/Platelet   TSH   Anemia Profile B   B12 deficiency   Relevant Orders   Vitamin B12      Other   Anemia   Relevant Orders   CBC with Differential/Platelet       Follow up plan: Return if symptoms worsen or fail to improve.  Counseling provided for all of the vaccine components Orders Placed This Encounter  Procedures  . BMP8+EGFR  . CBC with Differential/Platelet  . Vitamin B12  . TSH  . Anemia Profile B    Caryl Pina, MD [facility] 11/03/2015, 4:40 PM

## 2015-11-04 LAB — ANEMIA PROFILE B
BASOS ABS: 0 10*3/uL (ref 0.0–0.2)
BASOS: 1 %
EOS (ABSOLUTE): 0.1 10*3/uL (ref 0.0–0.4)
Eos: 1 %
FERRITIN: 5 ng/mL — AB (ref 15–150)
Folate: 4.5 ng/mL (ref >3.0)
HEMATOCRIT: 34 % (ref 34.0–46.6)
Hemoglobin: 10.2 g/dL — ABNORMAL LOW (ref 11.1–15.9)
IMMATURE GRANS (ABS): 0 10*3/uL (ref 0.0–0.1)
IMMATURE GRANULOCYTES: 0 %
Iron Saturation: 4 % — CL (ref 15–55)
Iron: 17 ug/dL — ABNORMAL LOW (ref 27–159)
LYMPHS: 31 %
Lymphocytes Absolute: 1.3 10*3/uL (ref 0.7–3.1)
MCH: 22.3 pg — ABNORMAL LOW (ref 26.6–33.0)
MCHC: 30 g/dL — ABNORMAL LOW (ref 31.5–35.7)
MCV: 74 fL — ABNORMAL LOW (ref 79–97)
MONOCYTES: 13 %
Monocytes Absolute: 0.5 10*3/uL (ref 0.1–0.9)
NEUTROS PCT: 54 %
Neutrophils Absolute: 2.3 10*3/uL (ref 1.4–7.0)
Platelets: 225 10*3/uL (ref 150–379)
RBC: 4.58 x10E6/uL (ref 3.77–5.28)
RDW: 16.6 % — AB (ref 12.3–15.4)
RETIC CT PCT: 0.9 % (ref 0.6–2.6)
Total Iron Binding Capacity: 424 ug/dL (ref 250–450)
UIBC: 407 ug/dL (ref 131–425)
Vitamin B-12: 572 pg/mL (ref 211–946)
WBC: 4.2 10*3/uL (ref 3.4–10.8)

## 2015-11-04 LAB — BMP8+EGFR
BUN/Creatinine Ratio: 29 — ABNORMAL HIGH (ref 8–20)
BUN: 14 mg/dL (ref 6–20)
CHLORIDE: 99 mmol/L (ref 96–106)
CO2: 21 mmol/L (ref 18–29)
Calcium: 8.6 mg/dL — ABNORMAL LOW (ref 8.7–10.2)
Creatinine, Ser: 0.49 mg/dL — ABNORMAL LOW (ref 0.57–1.00)
GFR, EST AFRICAN AMERICAN: 148 mL/min/{1.73_m2} (ref >59)
GFR, EST NON AFRICAN AMERICAN: 128 mL/min/{1.73_m2} (ref >59)
Glucose: 87 mg/dL (ref 65–99)
Potassium: 4.5 mmol/L (ref 3.5–5.2)
Sodium: 138 mmol/L (ref 134–144)

## 2015-11-04 LAB — TSH: TSH: 2.32 u[IU]/mL (ref 0.450–4.500)

## 2015-11-05 NOTE — Addendum Note (Signed)
Addended by: Thana Ates on: 11/05/2015 08:03 AM   Modules accepted: Orders

## 2015-11-17 ENCOUNTER — Ambulatory Visit (HOSPITAL_COMMUNITY): Payer: BC Managed Care – PPO | Admitting: Oncology

## 2015-11-23 ENCOUNTER — Ambulatory Visit (HOSPITAL_COMMUNITY): Payer: BC Managed Care – PPO | Admitting: Hematology & Oncology

## 2015-12-04 ENCOUNTER — Other Ambulatory Visit: Payer: Self-pay | Admitting: Physician Assistant

## 2015-12-06 NOTE — Telephone Encounter (Signed)
Last seen 11/03/15  Dr Dettinger

## 2015-12-20 ENCOUNTER — Telehealth: Payer: Self-pay | Admitting: Internal Medicine

## 2015-12-20 NOTE — Telephone Encounter (Signed)
Letter in the mail 

## 2015-12-20 NOTE — Telephone Encounter (Signed)
MARCH RECALL FOR BONE DENSITY IN 3 YRS

## 2016-03-21 ENCOUNTER — Encounter (INDEPENDENT_AMBULATORY_CARE_PROVIDER_SITE_OTHER): Payer: Self-pay

## 2016-03-21 ENCOUNTER — Encounter: Payer: Self-pay | Admitting: Family

## 2016-03-21 ENCOUNTER — Ambulatory Visit (INDEPENDENT_AMBULATORY_CARE_PROVIDER_SITE_OTHER): Payer: BC Managed Care – PPO | Admitting: Family

## 2016-03-21 ENCOUNTER — Encounter: Payer: Self-pay | Admitting: *Deleted

## 2016-03-21 VITALS — BP 116/83 | HR 115 | Temp 97.9°F | Ht 65.0 in | Wt 145.6 lb

## 2016-03-21 DIAGNOSIS — J0110 Acute frontal sinusitis, unspecified: Secondary | ICD-10-CM

## 2016-03-21 MED ORDER — AMOXICILLIN-POT CLAVULANATE 875-125 MG PO TABS: 1.0000 | ORAL_TABLET | Freq: Two times a day (BID) | ORAL | Status: DC

## 2016-03-21 MED ORDER — FLUTICASONE PROPIONATE 50 MCG/ACT NA SUSP: 2.0000 | Freq: Every day | NASAL | Status: DC

## 2016-03-21 NOTE — Patient Instructions (Signed)
Sinusitis, Adult Sinusitis is redness, soreness, and inflammation of the paranasal sinuses. Paranasal sinuses are air pockets within the bones of your face. They are located beneath your eyes, in the middle of your forehead, and above your eyes. In healthy paranasal sinuses, mucus is able to drain out, and air is able to circulate through them by way of your nose. However, when your paranasal sinuses are inflamed, mucus and air can become trapped. This can allow bacteria and other germs to grow and cause infection. Sinusitis can develop quickly and last only a short time (acute) or continue over a long period (chronic). Sinusitis that lasts for more than 12 weeks is considered chronic. CAUSES Causes of sinusitis include:  Allergies.  Structural abnormalities, such as displacement of the cartilage that separates your nostrils (deviated septum), which can decrease the air flow through your nose and sinuses and affect sinus drainage.  Functional abnormalities, such as when the small hairs (cilia) that line your sinuses and help remove mucus do not work properly or are not present. SIGNS AND SYMPTOMS Symptoms of acute and chronic sinusitis are the same. The primary symptoms are pain and pressure around the affected sinuses. Other symptoms include:  Upper toothache.  Earache.  Headache.  Bad breath.  Decreased sense of smell and taste.  A cough, which worsens when you are lying flat.  Fatigue.  Fever.  Thick drainage from your nose, which often is green and may contain pus (purulent).  Swelling and warmth over the affected sinuses. DIAGNOSIS Your health care provider will perform a physical exam. During your exam, your health care provider may perform any of the following to help determine if you have acute sinusitis or chronic sinusitis:  Look in your nose for signs of abnormal growths in your nostrils (nasal polyps).  Tap over the affected sinus to check for signs of  infection.  View the inside of your sinuses using an imaging device that has a light attached (endoscope). If your health care provider suspects that you have chronic sinusitis, one or more of the following tests may be recommended:  Allergy tests.  Nasal culture. A sample of mucus is taken from your nose, sent to a lab, and screened for bacteria.  Nasal cytology. A sample of mucus is taken from your nose and examined by your health care provider to determine if your sinusitis is related to an allergy. TREATMENT Most cases of acute sinusitis are related to a viral infection and will resolve on their own within 10 days. Sometimes, medicines are prescribed to help relieve symptoms of both acute and chronic sinusitis. These may include pain medicines, decongestants, nasal steroid sprays, or saline sprays. However, for sinusitis related to a bacterial infection, your health care provider will prescribe antibiotic medicines. These are medicines that will help kill the bacteria causing the infection. Rarely, sinusitis is caused by a fungal infection. In these cases, your health care provider will prescribe antifungal medicine. For some cases of chronic sinusitis, surgery is needed. Generally, these are cases in which sinusitis recurs more than 3 times per year, despite other treatments. HOME CARE INSTRUCTIONS  Drink plenty of water. Water helps thin the mucus so your sinuses can drain more easily.  Use a humidifier.  Inhale steam 3-4 times a day (for example, sit in the bathroom with the shower running).  Apply a warm, moist washcloth to your face 3-4 times a day, or as directed by your health care provider.  Use saline nasal sprays to help   moisten and clean your sinuses.  Take medicines only as directed by your health care provider.  If you were prescribed either an antibiotic or antifungal medicine, finish it all even if you start to feel better. SEEK IMMEDIATE MEDICAL CARE IF:  You have  increasing pain or severe headaches.  You have nausea, vomiting, or drowsiness.  You have swelling around your face.  You have vision problems.  You have a stiff neck.  You have difficulty breathing.   This information is not intended to replace advice given to you by your health care provider. Make sure you discuss any questions you have with your health care provider.   Document Released: 10/16/2005 Document Revised: 11/06/2014 Document Reviewed: 10/31/2011 Elsevier Interactive Patient Education 2016 [employer]  - Take meds as prescribed - Use a cool mist humidifier  -Use saline nose sprays frequently -Saline irrigations of the nose can be very helpful if done frequently.  * 4X daily for 1 week*  * Use of a nettie pot can be helpful with this. Follow directions with this* -Force fluids -For any cough or congestion  Use plain Mucinex- regular strength or max strength is fine   * Children- consult with Pharmacist for dosing -For fever or aces or pains- take tylenol or ibuprofen appropriate for age and weight.  * for fevers greater than 101 orally you may alternate ibuprofen and tylenol every  3 hours. -Throat lozenges if help   Torrence Hammack, FNP   

## 2016-03-21 NOTE — Progress Notes (Signed)
Subjective:    Patient ID: Olivia Vance, female    DOB: [birth year], 35 y.o.   MRN: [id number]  Headache  Associated symptoms include coughing, ear pain and sinus pressure. Pertinent negatives include no neck pain or sore throat.  Sinus Problem This is a new problem. The current episode started 1 to 4 weeks ago. The problem is unchanged. There has been no fever. Her pain is at a severity of 5/10. The pain is mild. Associated symptoms include coughing, ear pain, headaches and sinus pressure. Pertinent negatives include no congestion, hoarse voice, neck pain, shortness of breath, sneezing or sore throat. Past treatments include oral decongestants (Claritin). The treatment provided mild relief.      Review of Systems  Constitutional: Negative.   HENT: Positive for ear pain and sinus pressure. Negative for congestion, hoarse voice, sneezing and sore throat.   Eyes: Negative.   Respiratory: Positive for cough. Negative for shortness of breath.   Cardiovascular: Negative.  Negative for palpitations.  Gastrointestinal: Negative.   Endocrine: Negative.   Genitourinary: Negative.   Musculoskeletal: Negative.  Negative for neck pain.  Neurological: Positive for headaches.  Hematological: Negative.   Psychiatric/Behavioral: Negative.   All other systems reviewed and are negative.      Objective:   Physical Exam  Constitutional: She is oriented to person, place, and time. She appears well-developed and well-nourished. No distress.  HENT:  Head: Normocephalic and atraumatic.  Right Ear: External ear normal.  Nose: Mucosal edema and rhinorrhea present. Right sinus exhibits frontal sinus tenderness. Left sinus exhibits frontal sinus tenderness.  Mouth/Throat: Oropharynx is clear and moist.  Eyes: Pupils are equal, round, and reactive to light.  Neck: Normal range of motion. Neck supple. No thyromegaly present.  Cardiovascular: Normal rate, regular rhythm, normal heart sounds and intact  distal pulses.   No murmur heard. Pulmonary/Chest: Effort normal and breath sounds normal. No respiratory distress. She has no wheezes.  Abdominal: Soft. Bowel sounds are normal. She exhibits no distension. There is no tenderness.  Musculoskeletal: Normal range of motion. She exhibits no edema or tenderness.  Neurological: She is alert and oriented to person, place, and time. She has normal reflexes. No cranial nerve deficit.  Skin: Skin is warm and dry.  Psychiatric: She has a normal mood and affect. Her behavior is normal. Judgment and thought content normal.  Vitals reviewed.   BP 116/83 mmHg  Pulse 115  Temp(Src) 97.9 F (36.6 C) (Oral)  Ht 5\' 5"  (1.651 m)  Wt 145 lb 9.6 oz (66.044 kg)  BMI 24.23 kg/m2       Assessment & Plan:  1. Acute frontal sinusitis, recurrence not specified -- Take meds as prescribed - Use a cool mist humidifier  -Use saline nose sprays frequently -Saline irrigations of the nose can be very helpful if done frequently.  * 4X daily for 1 week*  * Use of a nettie pot can be helpful with this. Follow directions with this* -Force fluids -For any cough or congestion  Use plain Mucinex- regular strength or max strength is fine   * Children- consult with Pharmacist for dosing -For fever or aces or pains- take tylenol or ibuprofen appropriate for age and weight.  * for fevers greater than 101 orally you may alternate ibuprofen and tylenol every  3 hours. -Throat lozenges if help  - amoxicillin-clavulanate (AUGMENTIN) 875-125 MG tablet; Take 1 tablet by mouth 2 (two) times daily.  Dispense: 14 tablet; Refill: 0 - fluticasone (FLONASE) 50 MCG/ACT  nasal spray; Place 2 sprays into both nostrils daily.  Dispense: 16 g; Refill: [place], FNP

## 2016-04-10 ENCOUNTER — Telehealth: Payer: Self-pay | Admitting: Family Medicine

## 2016-04-10 NOTE — Telephone Encounter (Signed)
Appointment made

## 2016-04-10 NOTE — Telephone Encounter (Signed)
Looks like she was sent Augmentin which is a very strong antibiotic but if it has not cleared up and she still having a lot of issues then I would prefer that she come in and be seen and we can assess what is still going on and why it is still going on.

## 2016-04-11 ENCOUNTER — Encounter: Payer: Self-pay | Admitting: Physician Assistant

## 2016-04-11 ENCOUNTER — Ambulatory Visit (INDEPENDENT_AMBULATORY_CARE_PROVIDER_SITE_OTHER): Payer: BC Managed Care – PPO | Admitting: Physician Assistant

## 2016-04-11 VITALS — BP 98/65 | HR 81 | Temp 95.9°F | Ht 65.0 in | Wt 144.0 lb

## 2016-04-11 DIAGNOSIS — J321 Chronic frontal sinusitis: Secondary | ICD-10-CM

## 2016-04-11 MED ORDER — SULFAMETHOXAZOLE-TRIMETHOPRIM 800-160 MG PO TABS: 1.0000 | ORAL_TABLET | Freq: Two times a day (BID) | ORAL | Status: DC

## 2016-04-11 NOTE — Patient Instructions (Signed)

## 2016-04-11 NOTE — Progress Notes (Signed)
Subjective:     Patient ID: Olivia Vance, female   DOB: 06/21/1981, 35 y.o.   MRN: [id number]  HPI Pt here for f/u of sinusitis Prev seen by [facility] and placed on Augmentin Sx improved while on med but sx returned quickly after finishing med  Review of Systems  Constitutional: Negative for fever, chills, activity change, appetite change and fatigue.  HENT: Positive for congestion, postnasal drip and sinus pressure. Negative for ear discharge, ear pain, nosebleeds, sneezing and sore throat.   Respiratory: Positive for cough. Negative for shortness of breath and wheezing.   Cardiovascular: Negative.        Objective:   Physical Exam  Constitutional: She appears well-developed and well-nourished.  HENT:  Right Ear: External ear normal.  Left Ear: External ear normal.  Mouth/Throat: Oropharynx is clear and moist. No oropharyngeal exudate.  + frontal sinus TTP  Neck: Neck supple.  Cardiovascular: Normal rate, regular rhythm and normal heart sounds.   Pulmonary/Chest: Effort normal and breath sounds normal.  Lymphadenopathy:    She has no cervical adenopathy.  Nursing note and vitals reviewed.      Assessment:     1. Frontal sinusitis, unspecified chronicity        Plan:     Pt is getting ready to go to the beach so Bactrim DS x 2 weeks Continue with Flonase Fluids Rest F/U prn

## 2017-04-30 ENCOUNTER — Emergency Department (HOSPITAL_COMMUNITY): Payer: BC Managed Care – PPO

## 2017-04-30 ENCOUNTER — Encounter (HOSPITAL_COMMUNITY): Payer: Self-pay | Admitting: Emergency Medicine

## 2017-04-30 DIAGNOSIS — S300XXA Contusion of lower back and pelvis, initial encounter: Secondary | ICD-10-CM | POA: Diagnosis not present

## 2017-04-30 DIAGNOSIS — Y999 Unspecified external cause status: Secondary | ICD-10-CM | POA: Diagnosis not present

## 2017-04-30 DIAGNOSIS — Z79899 Other long term (current) drug therapy: Secondary | ICD-10-CM | POA: Diagnosis not present

## 2017-04-30 DIAGNOSIS — Y939 Activity, unspecified: Secondary | ICD-10-CM | POA: Diagnosis not present

## 2017-04-30 DIAGNOSIS — Y929 Unspecified place or not applicable: Secondary | ICD-10-CM | POA: Insufficient documentation

## 2017-04-30 DIAGNOSIS — W1830XA Fall on same level, unspecified, initial encounter: Secondary | ICD-10-CM | POA: Insufficient documentation

## 2017-04-30 DIAGNOSIS — S3992XA Unspecified injury of lower back, initial encounter: Secondary | ICD-10-CM | POA: Diagnosis present

## 2017-04-30 DIAGNOSIS — Z87891 Personal history of nicotine dependence: Secondary | ICD-10-CM | POA: Insufficient documentation

## 2017-04-30 LAB — CBC WITH DIFFERENTIAL/PLATELET
BASOS PCT: 0 %
Basophils Absolute: 0 10*3/uL (ref 0.0–0.1)
Eosinophils Absolute: 0 10*3/uL (ref 0.0–0.7)
Eosinophils Relative: 1 %
HEMATOCRIT: 34.2 % — AB (ref 36.0–46.0)
HEMOGLOBIN: 10.9 g/dL — AB (ref 12.0–15.0)
Lymphocytes Relative: 37 %
Lymphs Abs: 1.8 10*3/uL (ref 0.7–4.0)
MCH: 26 pg (ref 26.0–34.0)
MCHC: 31.9 g/dL (ref 30.0–36.0)
MCV: 81.6 fL (ref 78.0–100.0)
Monocytes Absolute: 0.4 10*3/uL (ref 0.1–1.0)
Monocytes Relative: 8 %
NEUTROS ABS: 2.6 10*3/uL (ref 1.7–7.7)
NEUTROS PCT: 54 %
Platelets: 183 10*3/uL (ref 150–400)
RBC: 4.19 MIL/uL (ref 3.87–5.11)
RDW: 16.3 % — AB (ref 11.5–15.5)
WBC: 4.8 10*3/uL (ref 4.0–10.5)

## 2017-04-30 LAB — I-STAT BETA HCG BLOOD, ED (MC, WL, AP ONLY)

## 2017-04-30 LAB — COMPREHENSIVE METABOLIC PANEL
ALK PHOS: 61 U/L (ref 38–126)
ALT: 32 U/L (ref 14–54)
AST: 30 U/L (ref 15–41)
Albumin: 3.8 g/dL (ref 3.5–5.0)
Anion gap: 8 (ref 5–15)
BILIRUBIN TOTAL: 0.4 mg/dL (ref 0.3–1.2)
BUN: 10 mg/dL (ref 6–20)
CALCIUM: 8.6 mg/dL — AB (ref 8.9–10.3)
CO2: 22 mmol/L (ref 22–32)
Chloride: 106 mmol/L (ref 101–111)
Creatinine, Ser: 0.73 mg/dL (ref 0.44–1.00)
GFR calc Af Amer: >60 mL/min (ref >60)
Glucose, Bld: 101 mg/dL — ABNORMAL HIGH (ref 65–99)
POTASSIUM: 4.2 mmol/L (ref 3.5–5.1)
Sodium: 136 mmol/L (ref 135–145)
TOTAL PROTEIN: 6.1 g/dL — AB (ref 6.5–8.1)

## 2017-04-30 NOTE — ED Triage Notes (Signed)
Patient slipped and fell at home this evening with brief LOC , alert and oriented at arrival /respirations unlabored , pt. reports left lower back pain worse with movement .

## 2017-05-01 ENCOUNTER — Emergency Department (HOSPITAL_COMMUNITY)
Admission: EM | Admit: 2017-05-01 | Discharge: 2017-05-01 | Disposition: A | Discharge status: Home / Self Care | Payer: BC Managed Care – PPO | Attending: Emergency Medicine | Admitting: Emergency Medicine

## 2017-05-01 DIAGNOSIS — S300XXA Contusion of lower back and pelvis, initial encounter: Secondary | ICD-10-CM

## 2017-05-01 DIAGNOSIS — W108XXA Fall (on) (from) other stairs and steps, initial encounter: Secondary | ICD-10-CM

## 2017-05-01 MED ORDER — CYCLOBENZAPRINE HCL 10 MG PO TABS
5.0000 mg | ORAL_TABLET | Freq: Once | ORAL | Status: AC
  Administered 2017-05-01: 5 mg via ORAL
  Filled 2017-05-01: qty 1

## 2017-05-01 MED ORDER — IBUPROFEN 600 MG PO TABS: 600.0000 mg | ORAL_TABLET | Freq: Four times a day (QID) | ORAL | 0 refills | Status: DC | PRN

## 2017-05-01 MED ORDER — CYCLOBENZAPRINE HCL 10 MG PO TABS: 10.0000 mg | ORAL_TABLET | Freq: Two times a day (BID) | ORAL | 0 refills | Status: DC | PRN

## 2017-05-01 MED ORDER — IBUPROFEN 800 MG PO TABS
800.0000 mg | ORAL_TABLET | Freq: Once | ORAL | Status: AC
  Administered 2017-05-01: 800 mg via ORAL
  Filled 2017-05-01: qty 1

## 2017-05-01 NOTE — ED Provider Notes (Signed)
Olivia Vance DEPT Provider Note   CSN: [id number] Arrival date & time: 04/30/17  2157     History   Chief Complaint Chief Complaint  Patient presents with  . Fall    Back Pain     HPI Olivia Vance is a 36 y.o. female.  HPI   36 year old female presenting for evaluation of a recent fall. Patient states last night around 9 PM, patient was carrying several things on her arms, walking down the steps when she tripped on her and fell down the steps. It was a witnessed fall and husband who states patient may had a brief syncopal episode after the fall lasting for less than a minute. Patient came to without any post ictal states or any seizure activity. She denies hitting her head and does not complain of any headache. Her only complaint is pain to her left lower back. Pain is described as sharp, tight, worsening with movement, nonradiating. She denies any associated headache, lightheadedness, dizziness, nausea, vomiting, focal weakness but does endorse numbness at the site of injury. No specific treatment tried. EMS was initially contact came out and check her. It was noted that she has a normal CBG and normal blood pressure. They did encourage patient to be seen in ER. Patient arrived by private vehicle. Currently she is in no acute discomfort and she denies any precipitating symptoms prior to the fall.  F      Past Medical History:  Diagnosis Date  . Celiac disease 2007   Biopsy proven.  . Previous pregnancy with HELLP syndrome, antepartum 03/2011    Patient Active Problem List   Diagnosis Date Noted  . Postoperative state 07/03/2014  . Celiac disease 07/17/2011  . B12 deficiency 07/17/2011    Class: History of  . Anemia 07/17/2011  . Abnormal LFTs 07/17/2011    Past Surgical History:  Procedure Laterality Date  . CESAREAN SECTION N/A 07/03/2014   Procedure: CESAREAN SECTION;  Surgeon: Daria Pastures, MD;  Location: [facility];  Service: Obstetrics;  Laterality: N/A;  .  ESOPHAGOGASTRODUODENOSCOPY  06/2006   small hh, pale duodenal bulb, 2nd and 3rd portions of duodenum abnormal appearing. Bx c/w celiac disease. Positive serologies.    OB History    Gravida Para Term Preterm AB Living   2 2 2     2    SAB TAB Ectopic Multiple Live Births           2       Home Medications    Prior to Admission medications   Medication Sig Start Date End Date Taking? Authorizing Provider  escitalopram (LEXAPRO) 20 MG tablet Take 20 mg by mouth daily.   Yes [provider]  fluticasone (FLONASE) 50 MCG/ACT nasal spray Place 2 sprays into both nostrils daily. Patient not taking: Reported on 04/11/2016 03/21/16   Evelina Dun A, FNP  sulfamethoxazole-trimethoprim (BACTRIM DS) 800-160 MG tablet Take 1 tablet by mouth 2 (two) times daily. Patient not taking: Reported on 05/01/2017 04/11/16   Lodema Pilot, PA-C    Family History Family History  Problem Relation Age of Onset  . Irritable bowel syndrome Mother   . GER disease Father     Social History Social History  Substance Use Topics  . Smoking status: Former Smoker    Packs/day: 1.00    Types: Cigarettes  . Smokeless tobacco: Never Used     Comment: quit last sumer  . Alcohol use No     Allergies   Patient has  no known allergies.   Review of Systems Review of Systems  All other systems reviewed and are negative.    Physical Exam Updated Vital Signs BP 99/69   Pulse 81   Temp 98.3 F (36.8 C) (Oral)   Resp 20   Ht 5\' 5"  (1.651 m)   Wt 63.5 kg (140 lb)   LMP 04/23/2017 (Approximate)   SpO2 100%   BMI 23.30 kg/m   Physical Exam  Constitutional: She is oriented to person, place, and time. She appears well-developed and well-nourished. No distress.  HENT:  Head: Atraumatic.  Eyes: Conjunctivae are normal.  Neck: Neck supple.  Cardiovascular: Normal rate and regular rhythm.   Pulmonary/Chest: Effort normal and breath sounds normal.  Abdominal: Soft. Bowel sounds are normal.  She exhibits no distension. There is no tenderness.  Musculoskeletal: She exhibits tenderness (Tenderness to left paraspinal lumbar muscle on palpation without any overlying bruising. No significant midline spine tenderness crepitus or step-off. No CVA tenderness.).  Neurological: She is alert and oriented to person, place, and time. She has normal strength. No cranial nerve deficit or sensory deficit. GCS eye subscore is 4. GCS verbal subscore is 5. GCS motor subscore is 6.  Able to ambulate. 5 out 5 strength to bilateral lower extremities. No foot drops. Patellar deep tendon reflexes intact.   Skin: No rash noted.  Psychiatric: She has a normal mood and affect.  Nursing note and vitals reviewed.    ED Treatments / Results  Labs (all labs ordered are listed, but only abnormal results are displayed) Labs Reviewed  CBC WITH DIFFERENTIAL/PLATELET - Abnormal; Notable for the following:       Result Value   Hemoglobin 10.9 (*)    HCT 34.2 (*)    RDW 16.3 (*)    All other components within normal limits  COMPREHENSIVE METABOLIC PANEL - Abnormal; Notable for the following:    Glucose, Bld 101 (*)    Calcium 8.6 (*)    Total Protein 6.1 (*)    All other components within normal limits  I-STAT BETA HCG BLOOD, ED ([facility], [facility], [facility] ONLY)    EKG  EKG Interpretation None       Radiology Dg Lumbar Spine Complete  Result Date: 04/30/2017 CLINICAL DATA:  Status post fall, with left lower back pain. Initial encounter. EXAM: LUMBAR SPINE - COMPLETE 4+ VIEW COMPARISON:  None. FINDINGS: There is no evidence of fracture or subluxation. Vertebral bodies demonstrate normal height and alignment. Intervertebral disc spaces are preserved. The visualized neural foramina are grossly unremarkable in appearance. The visualized bowel gas pattern is unremarkable in appearance; air and stool are noted within the colon. The sacroiliac joints are within normal limits. IMPRESSION: No evidence of fracture or subluxation  along the lumbar spine. Electronically Signed   By: Garald Balding M.D.   On: 04/30/2017 23:03    Procedures Procedures (including critical care time)  Medications Ordered in ED Medications  cyclobenzaprine (FLEXERIL) tablet 5 mg (not administered)  ibuprofen (ADVIL,MOTRIN) tablet 800 mg (not administered)     Initial Impression / Assessment and Plan / ED Course  I have reviewed the triage vital signs and the nursing notes.  Pertinent labs & imaging results that were available during my care of the patient were reviewed by me and considered in my medical decision making (see chart for details).     BP 104/71   Pulse 86   Temp 98.3 F (36.8 C) (Oral)   Resp 20   Ht 5'  5" (1.651 m)   Wt 63.5 kg (140 lb)   LMP 04/23/2017 (Approximate)   SpO2 97%   BMI 23.30 kg/m    Final Clinical Impressions(s) / ED Diagnoses   Final diagnoses:  Fall down steps, initial encounter  Contusion of lower back, initial encounter    New Prescriptions New Prescriptions   CYCLOBENZAPRINE (FLEXERIL) 10 MG TABLET    Take 1 tablet (10 mg total) by mouth 2 (two) times daily as needed for muscle spasms.   IBUPROFEN (ADVIL,MOTRIN) 600 MG TABLET    Take 1 tablet (600 mg total) by mouth every 6 (six) hours as needed.   3:17 AM Patient here for evaluation of a mechanical fall. They also report of a witnessed syncopal episode. She is back in her baseline and does not have significant signs of head injury on neck pain. She has no focal neuro deficit on exam. She is mentating appropriately. She does have tenderness to her left low back. L-spine x-ray is unremarkable. She is able to ambulate. Patient is neurovascularly intact. She feels comfortable going home. Will provide symptomatic treatment for her symptoms. Return precaution discussed.   Domenic Moras, PA-C 05/01/17 0320    Orpah Greek, MD 05/01/17 [phone]

## 2017-10-22 IMAGING — DX DG LUMBAR SPINE COMPLETE 4+V
5 series · 5 of 5 positions shown · non-contrast
Comparison: None.

CLINICAL DATA: Status post fall, with left lower back pain. Initial
encounter.

EXAM:
LUMBAR SPINE - COMPLETE 4+ VIEW

[l-spine ap]
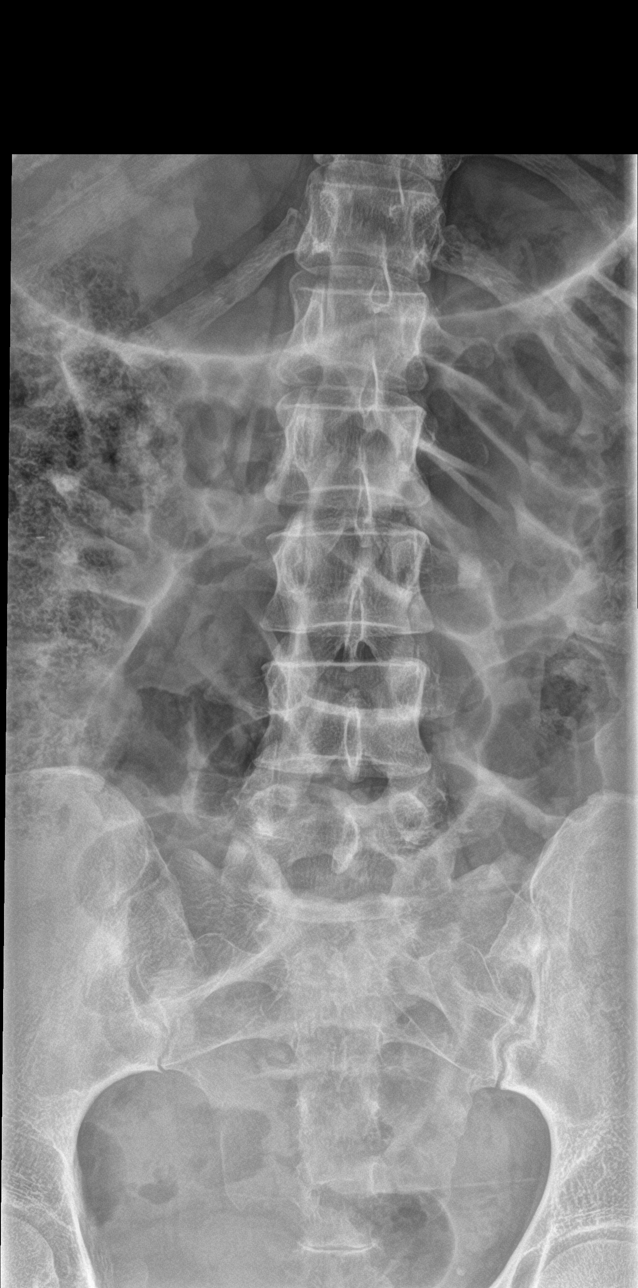

[l-spine obl (1 of 2)]
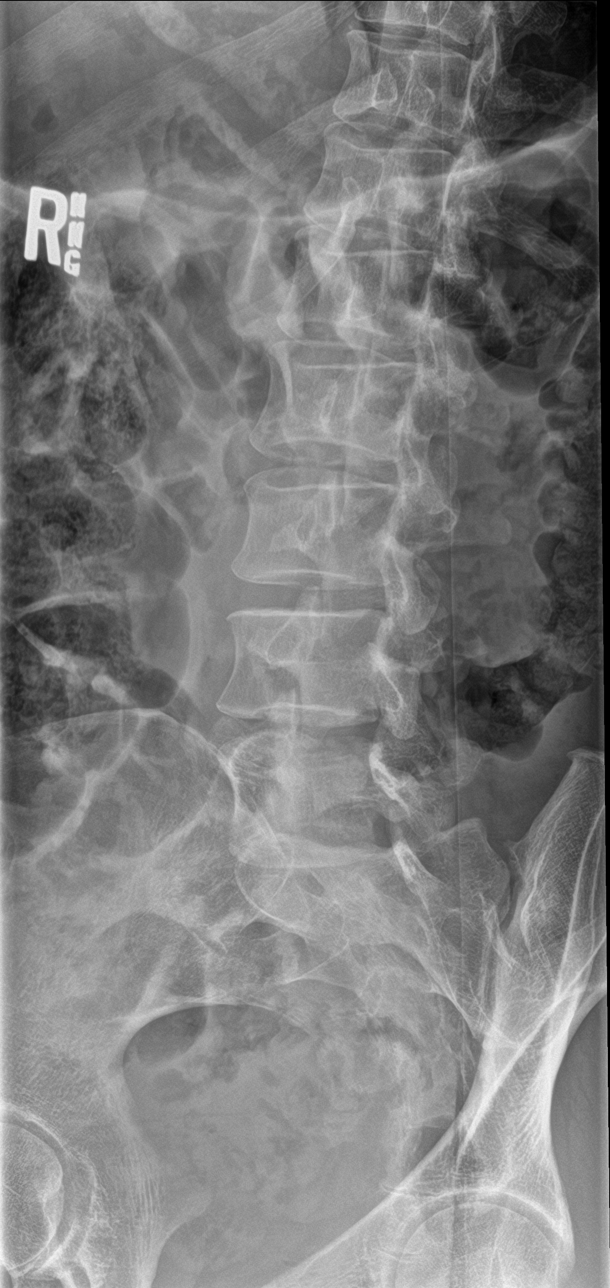

[l-spine obl (2 of 2)]
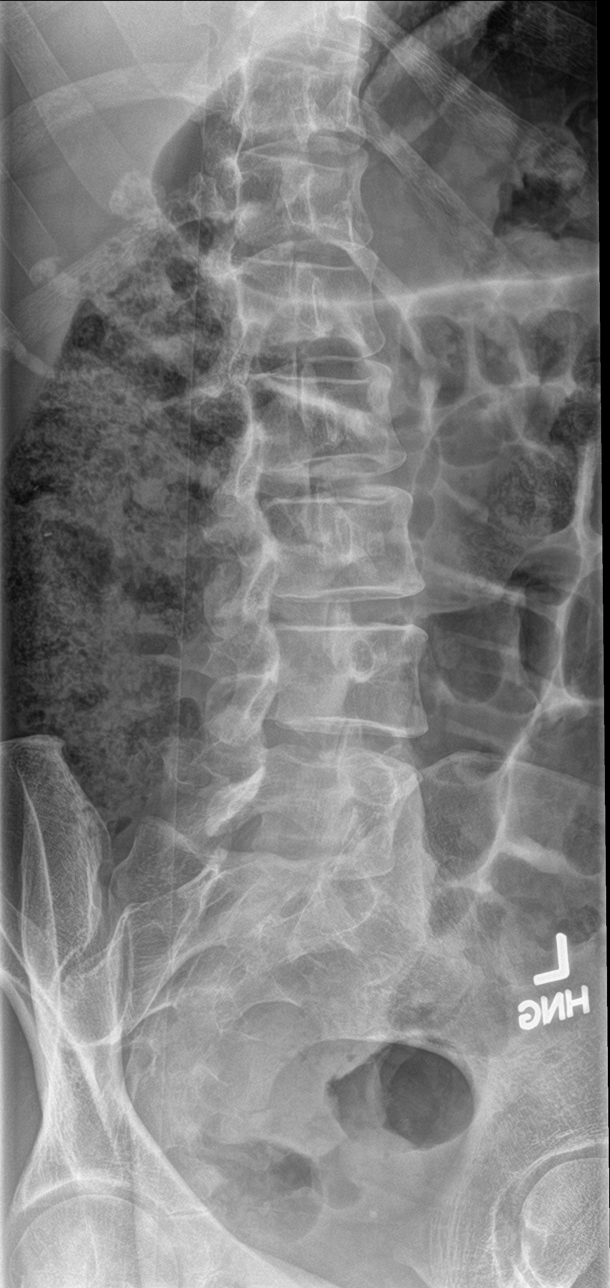

[l-spine lat]
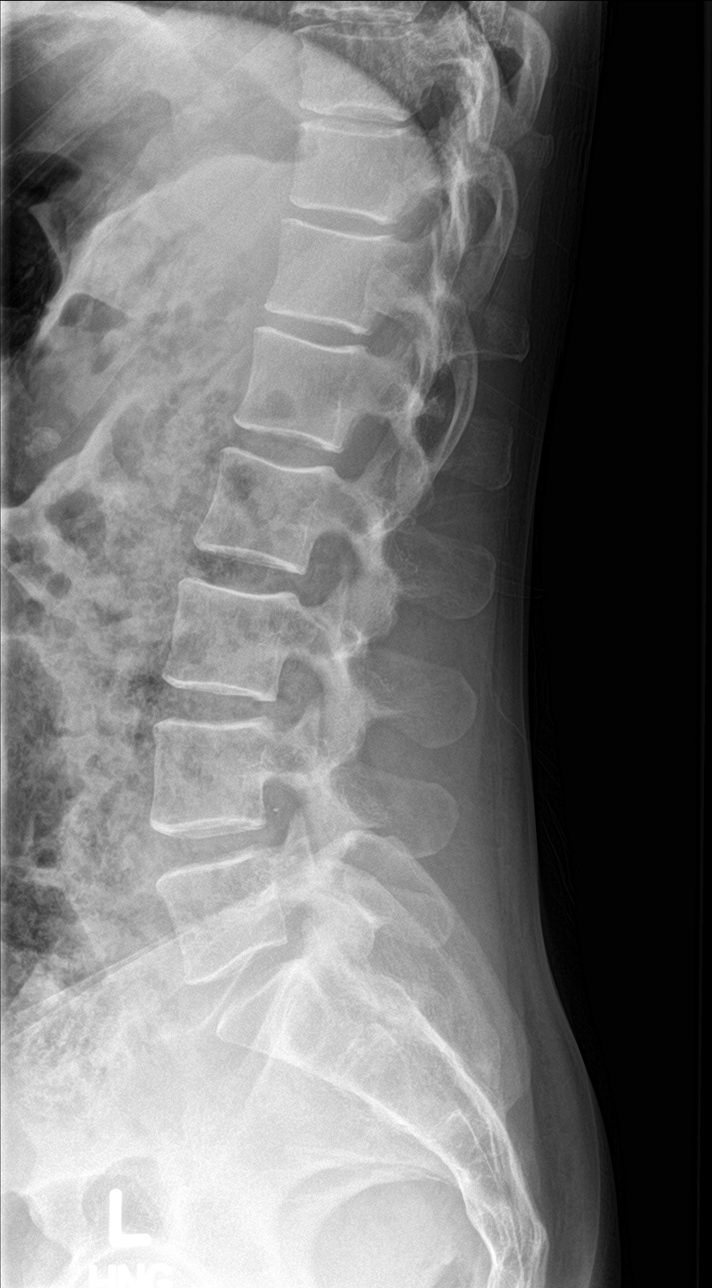

[l-spine spot]
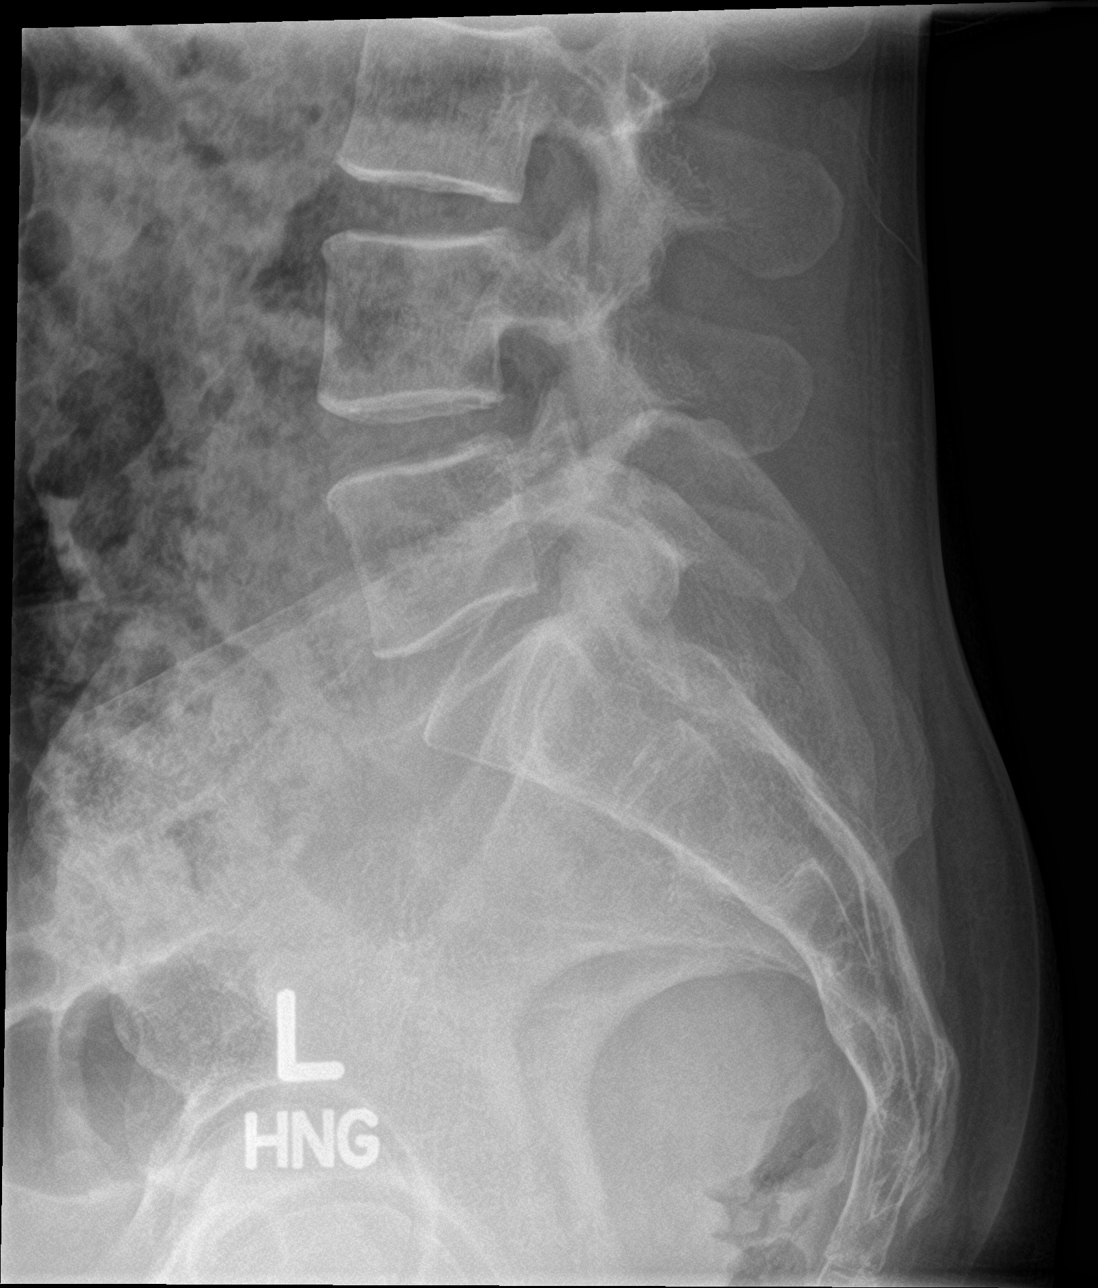

[5 of 5 positions shown; findings below may reference images not displayed]

FINDINGS: There is no evidence of fracture or subluxation. Vertebral bodies
demonstrate normal height and alignment. Intervertebral disc spaces
are preserved. The visualized neural foramina are grossly
unremarkable in appearance.

The visualized bowel gas pattern is unremarkable in appearance; air
and stool are noted within the colon. The sacroiliac joints are
within normal limits.
IMPRESSION: No evidence of fracture or subluxation along the lumbar spine.

## 2018-01-14 ENCOUNTER — Ambulatory Visit: Payer: BC Managed Care – PPO | Admitting: Family Medicine

## 2018-01-14 ENCOUNTER — Encounter: Payer: Self-pay | Admitting: Family Medicine

## 2018-01-14 VITALS — BP 110/75 | HR 97 | Temp 96.8°F | Ht 65.0 in | Wt 145.8 lb

## 2018-01-14 DIAGNOSIS — J0110 Acute frontal sinusitis, unspecified: Secondary | ICD-10-CM

## 2018-01-14 MED ORDER — AMOXICILLIN-POT CLAVULANATE 875-125 MG PO TABS: 1.0000 | ORAL_TABLET | Freq: Two times a day (BID) | ORAL | 0 refills | Status: DC

## 2018-01-14 NOTE — Progress Notes (Signed)
   HPI  Patient presents today here for concern for sinus infection.  Patient explains she has had 5-6 days of cough, congestion, frontal headache, maxillary sinus pain and pressure, pain in the upper teeth, ear itching  Has fever, chills, sweats, dyspnea. She does have history of sinus infections.  PMH: Smoking status noted ROS: Per HPI  Objective: BP 110/75   Pulse 97   Temp (!) 96.8 F (36 C) (Oral)   Ht 5\' 5"  (1.651 m)   Wt 145 lb 12.8 oz (66.1 kg)   SpO2 97%   BMI 24.26 kg/m  Gen: NAD, alert, cooperative with exam HEENT: NCAT, no sinus TTP, TMs WNL BL, oropharynx clear CV: RRR, good S1/S2, no murmur Resp: CTABL, no wheezes, non-labored Ext: No edema, warm Neuro: Alert and oriented, No gross deficits  Assessment and plan:  #Acute sinusitis-frontal Based on symptoms, no tenderness on exam Treating with Augmentin Return to clinic with any concerns    Meds ordered this encounter  Medications  . amoxicillin-clavulanate (AUGMENTIN) 875-125 MG tablet    Sig: Take 1 tablet by mouth 2 (two) times daily.    Dispense:  20 tablet    Refill:  0    Laroy Apple, MD [facility] 01/14/2018, 4:01 PM

## 2018-01-14 NOTE — Patient Instructions (Signed)

## 2018-09-09 ENCOUNTER — Ambulatory Visit: Payer: BC Managed Care – PPO | Admitting: Family Medicine

## 2018-09-09 ENCOUNTER — Encounter: Payer: Self-pay | Admitting: Family Medicine

## 2018-09-09 ENCOUNTER — Ambulatory Visit: Payer: BC Managed Care – PPO | Admitting: Family

## 2018-09-09 VITALS — BP 120/81 | HR 114 | Temp 98.0°F | Ht 65.0 in | Wt 147.0 lb

## 2018-09-09 DIAGNOSIS — J0110 Acute frontal sinusitis, unspecified: Secondary | ICD-10-CM | POA: Diagnosis not present

## 2018-09-09 MED ORDER — AMOXICILLIN-POT CLAVULANATE 875-125 MG PO TABS: 1.0000 | ORAL_TABLET | Freq: Two times a day (BID) | ORAL | 0 refills | Status: AC

## 2018-09-09 MED ORDER — FLUCONAZOLE 150 MG PO TABS: 150.0000 mg | ORAL_TABLET | Freq: Once | ORAL | 0 refills | Status: AC

## 2018-09-09 NOTE — Progress Notes (Signed)
  Subjective:     Olivia Vance is a 37 y.o. female who presents for evaluation of sinus pain. Symptoms include: congestion, cough, facial pain, headaches, nasal congestion, post nasal drip, purulent rhinorrhea, sinus pressure, sneezing, sore throat and chest congestion. Onset of symptoms was 2 weeks ago. Symptoms have been gradually worsening since that time. Past history is significant for no history of pneumonia or bronchitis. Patient is a non-smoker.  The following portions of the patient's history were reviewed and updated as appropriate: allergies, current medications, past family history, past medical history, past social history, past surgical history and problem list.  Review of Systems Constitutional: positive for fatigue and chills Eyes: negative Ears, nose, mouth, throat, and face: positive for nasal congestion, sore throat and sinus pressure, pressure in ears, upper dental pain Respiratory: positive for cough and sputum Cardiovascular: negative Gastrointestinal: negative Neurological: positive for headaches   Objective:    BP 120/81   Pulse (!) 114   Temp 98 F (36.7 C) (Oral)   Ht 5\' 5"  (1.651 m)   Wt 147 lb (66.7 kg)   SpO2 98%   BMI 24.46 kg/m  General appearance: alert, cooperative and appears stated age Head: Normocephalic, without obvious abnormality, atraumatic Eyes: conjunctivae/corneas clear. PERRL, EOM's intact. Fundi benign. Ears: abnormal TM right ear - serous middle ear fluid and abnormal TM left ear - serous middle ear fluid Nose: Nares normal. Septum midline. Mucosa normal. No drainage or sinus tenderness., clear discharge, mild congestion, turbinates red, swollen, sinus tenderness bilateral, mild frontal sinus tenderness bilateral Throat: abnormal findings: mild oropharyngeal erythema Neck: mild anterior cervical adenopathy, supple, symmetrical, trachea midline and thyroid not enlarged, symmetric, no tenderness/mass/nodules Lungs: clear to auscultation  bilaterally Heart: regular rate and rhythm, S1, S2 normal, no murmur, click, rub or gallop Skin: Skin color, texture, turgor normal. No rashes or lesions Neurologic: Grossly normal    Assessment:   1. Acute non-recurrent frontal sinusitis 1. Take meds as prescribed. 2. Use a cool mist humidifier especially during the winter months when humid outside. 3. Use saline nose sprays frequently. Flonase once daily.  4. Saline irrigations of the nose can be very helpful if done frequently: 4 times daily for 1 week. Use of a nettie pot can be helpful with this. Follow directions with this and DO NOT use tap water.  5. Drink plenty of fluids. 6. Keep heat in house lower.  7.For any cough or congestion:  Use plain Mucinex- regular strength or max strength is fine. 8. For fever or aces or pains- take tylenol or ibuprofen appropriate for age and weight: For fevers greater than 101 orally you may alternate ibuprofen and tylenol every 3 hours.    - amoxicillin-clavulanate (AUGMENTIN) 875-125 MG tablet; Take 1 tablet by mouth 2 (two) times daily for 7 days.  Dispense: 14 tablet; Refill: 0   Plan:     Return if symptoms worsen or fail to improve.   The above assessment and management plan was discussed with the patient. The patient verbalized understanding of and has agreed to the management plan. Patient is aware to call the clinic if symptoms fail to improve or worsen. Patient is aware when to return to the clinic for a follow-up visit. Patient educated on when it is appropriate to go to the emergency department.   Monia Pouch, FNP-C [facility] [address] [place], [place] [address] [phone]

## 2018-09-09 NOTE — Patient Instructions (Signed)
1. Take meds as prescribed. 2. Use a cool mist humidifier especially during the winter months when humid outside. 3. Use saline nose sprays frequently. 4. Saline irrigations of the nose can be very helpful if done frequently:  4 times daily for 1 week.  Use of a nettie pot can be helpful with this. Follow directions with this and DO NOT use tap water.  5. Drink plenty of fluids. 6. Keep heat in house lower.  7.For any cough or congestion:  Use plain Mucinex- regular strength or max strength is fine.  Children - consult pharmacist for dosing. 8. For fever or aces or pains- take tylenol or ibuprofen appropriate for age and weight:  For fevers greater than 101 orally you may alternate ibuprofen and tylenol every 3 hours.     Sinusitis, Adult Sinusitis is soreness and inflammation of your sinuses. Sinuses are hollow spaces in the bones around your face. They are located:  Around your eyes.  In the middle of your forehead.  Behind your nose.  In your cheekbones.  Your sinuses and nasal passages are lined with a stringy fluid (mucus). Mucus normally drains out of your sinuses. When your nasal tissues get inflamed or swollen, the mucus can get trapped or blocked so air cannot flow through your sinuses. This lets bacteria, viruses, and funguses grow, and that leads to infection. Follow these instructions at home: Medicines  Take, use, or apply over-the-counter and prescription medicines only as told by your doctor. These may include nasal sprays.  If you were prescribed an antibiotic medicine, take it as told by your doctor. Do not stop taking the antibiotic even if you start to feel better. Hydrate and Humidify  Drink enough water to keep your pee (urine) clear or pale yellow.  Use a cool mist humidifier to keep the humidity level in your home above 50%.  Breathe in steam for 10-15 minutes, 3-4 times a day or as told by your doctor. You can do this in the bathroom while a hot shower  is running.  Try not to spend time in cool or dry air. Rest  Rest as much as possible.  Sleep with your head raised (elevated).  Make sure to get enough sleep each night. General instructions  Put a warm, moist washcloth on your face 3-4 times a day or as told by your doctor. This will help with discomfort.  Wash your hands often with soap and water. If there is no soap and water, use hand sanitizer.  Do not smoke. Avoid being around people who are smoking (secondhand smoke).  Keep all follow-up visits as told by your doctor. This is important. Contact a doctor if:  You have a fever.  Your symptoms get worse.  Your symptoms do not get better within 10 days. Get help right away if:  You have a very bad headache.  You cannot stop throwing up (vomiting).  You have pain or swelling around your face or eyes.  You have trouble seeing.  You feel confused.  Your neck is stiff.  You have trouble breathing. This information is not intended to replace advice given to you by your health care provider. Make sure you discuss any questions you have with your health care provider. Document Released: 04/03/2008 Document Revised: 06/11/2016 Document Reviewed: 08/11/2015 Elsevier Interactive Patient Education  [employer].

## 2018-12-09 ENCOUNTER — Encounter: Payer: Self-pay | Admitting: Family Medicine

## 2018-12-09 ENCOUNTER — Ambulatory Visit: Payer: BC Managed Care – PPO | Admitting: Family Medicine

## 2018-12-09 VITALS — BP 97/61 | HR 80 | Temp 98.8°F | Ht 65.0 in | Wt 145.5 lb

## 2018-12-09 DIAGNOSIS — J320 Chronic maxillary sinusitis: Secondary | ICD-10-CM

## 2018-12-09 MED ORDER — AMOXICILLIN-POT CLAVULANATE 875-125 MG PO TABS: 1.0000 | ORAL_TABLET | Freq: Two times a day (BID) | ORAL | 0 refills | Status: DC

## 2018-12-09 MED ORDER — BETAMETHASONE SOD PHOS & ACET 6 (3-3) MG/ML IJ SUSP
6.0000 mg | Freq: Once | INTRAMUSCULAR | Status: AC
  Administered 2018-12-09: 6 mg via INTRAMUSCULAR

## 2018-12-09 NOTE — Progress Notes (Signed)
Chief Complaint  Patient presents with  . Sinus Problem    HPI  Patient presents today for Patient presents with upper respiratory congestion. Rhinorrhea that is frequently purulent. There is moderate sore throat. Patient reports coughing frequently as well.  no sputum noted. There is no fever, chills or sweats. The patient denies being short of breath. Onset was 3-5 days ago. Gradually worsening. Tried OTCs without improvement.  PMH: Smoking status noted ROS: Per HPI  Objective: BP 97/61   Pulse 80   Temp 98.8 F (37.1 C) (Oral)   Ht 5\' 5"  (1.651 m)   Wt 145 lb 8 oz (66 kg)   BMI 24.21 kg/m  Gen: NAD, alert, cooperative with exam HEENT: NCAT, Nasal passages swollen, Max tenderness on right for percussion CV: RRR, good S1/S2, no murmur Resp: Bronchitis changes with scattered wheezes, non-labored Ext: No edema, warm Neuro: Alert and oriented, No gross deficits  Assessment and plan:  1. Maxillary sinusitis, unspecified chronicity     Meds ordered this encounter  Medications  . betamethasone acetate-betamethasone sodium phosphate (CELESTONE) injection 6 mg  . amoxicillin-clavulanate (AUGMENTIN) 875-125 MG tablet    Sig: Take 1 tablet by mouth 2 (two) times daily. Take all of this medication    Dispense:  20 tablet    Refill:  0     Follow up as needed.  Claretta Fraise, MD

## 2019-08-01 ENCOUNTER — Ambulatory Visit (INDEPENDENT_AMBULATORY_CARE_PROVIDER_SITE_OTHER): Payer: BC Managed Care – PPO | Admitting: Nurse Practitioner

## 2019-08-01 ENCOUNTER — Other Ambulatory Visit: Payer: Self-pay

## 2019-08-01 ENCOUNTER — Encounter: Payer: Self-pay | Admitting: Nurse Practitioner

## 2019-08-01 DIAGNOSIS — J0100 Acute maxillary sinusitis, unspecified: Secondary | ICD-10-CM

## 2019-08-01 MED ORDER — AMOXICILLIN-POT CLAVULANATE 875-125 MG PO TABS: 1.0000 | ORAL_TABLET | Freq: Two times a day (BID) | ORAL | 0 refills | Status: DC

## 2019-08-01 NOTE — Progress Notes (Signed)
Virtual Visit via telephone Note Due to COVID-19 pandemic this visit was conducted virtually. This visit type was conducted due to national recommendations for restrictions regarding the COVID-19 Pandemic (e.g. social distancing, sheltering in place) in an effort to limit this patient's exposure and mitigate transmission in our community. All issues noted in this document were discussed and addressed.  A physical exam was not performed with this format.  I connected with Olivia Vance on 08/01/19 at 11:15 by telephone and verified that I am speaking with the correct person using two identifiers. Olivia Vance is currently located at [facility] and no one is currently with her during visit. The provider, Mary-Margaret Hassell Done, FNP is located in their office at time of visit.  I discussed the limitations, risks, security and privacy concerns of performing an evaluation and management service by telephone and the availability of in person appointments. I also discussed with the patient that there may be a patient responsible charge related to this service. The patient expressed understanding and agreed to proceed.   History and Present Illness:  patient calls in today c/o facial pressure, teeth hurting when sh echews. No fever but has been going on for a week. No direct covid exposure.   Review of Systems  HENT: Positive for congestion, sinus pain and sore throat. Negative for ear pain.   Respiratory: Negative for cough.   Neurological: Positive for headaches. Negative for dizziness.  All other systems reviewed and are negative.    Observations/Objective: Alert and oriented- answers all questions appropriately No distress    Assessment and Plan: Alert and oriented- answers all questions appropriately No distress    Follow Up Instructions: Olivia Vance in today with chief complaint of URI   1. Acute maxillary sinusitis, recurrence not specified 1. Take meds as prescribed 2. Use a  cool mist humidifier especially during the winter months and when heat has been humid. 3. Use saline nose sprays frequently 4. Saline irrigations of the nose can be very helpful if done frequently.  * 4X daily for 1 week*  * Use of a nettie pot can be helpful with this. Follow directions with this* 5. Drink plenty of fluids 6. Keep thermostat turn down low 7.For any cough or congestion  Use plain Mucinex- regular strength or max strength is fine   * Children- consult with Pharmacist for dosing 8. For fever or aces or pains- take tylenol or ibuprofen appropriate for age and weight.  * for fevers greater than 101 orally you may alternate ibuprofen and tylenol every  3 hours.   Meds ordered this encounter  Medications  . amoxicillin-clavulanate (AUGMENTIN) 875-125 MG tablet    Sig: Take 1 tablet by mouth 2 (two) times daily.    Dispense:  14 tablet    Refill:  0    Order Specific Question:   Supervising Provider    Answer:   Caryl Pina A [id number]          I discussed the assessment and treatment plan with the patient. The patient was provided an opportunity to ask questions and all were answered. The patient agreed with the plan and demonstrated an understanding of the instructions.   The patient was advised to call back or seek an in-person evaluation if the symptoms worsen or if the condition fails to improve as anticipated.  The above assessment and management plan was discussed with the patient. The patient verbalized understanding of and has agreed to the management plan. Patient  is aware to call the clinic if symptoms persist or worsen. Patient is aware when to return to the clinic for a follow-up visit. Patient educated on when it is appropriate to go to the emergency department.   Time call ended:  11:25  I provided 10 minutes of non-face-to-face time during this encounter.    Mary-Margaret Hassell Done, FNP

## 2020-04-12 ENCOUNTER — Encounter: Payer: Self-pay | Admitting: Nurse Practitioner

## 2020-04-12 ENCOUNTER — Telehealth (INDEPENDENT_AMBULATORY_CARE_PROVIDER_SITE_OTHER): Payer: BC Managed Care – PPO | Admitting: Nurse Practitioner

## 2020-04-12 ENCOUNTER — Other Ambulatory Visit: Payer: Self-pay

## 2020-04-12 DIAGNOSIS — J0100 Acute maxillary sinusitis, unspecified: Secondary | ICD-10-CM

## 2020-04-12 MED ORDER — FLUCONAZOLE 150 MG PO TABS: 150.0000 mg | ORAL_TABLET | Freq: Once | ORAL | 0 refills | Status: AC

## 2020-04-12 MED ORDER — AMOXICILLIN-POT CLAVULANATE 875-125 MG PO TABS: 1.0000 | ORAL_TABLET | Freq: Two times a day (BID) | ORAL | 0 refills | Status: DC

## 2020-04-12 NOTE — Progress Notes (Signed)
Virtual Visit via telephone Note Due to COVID-19 pandemic this visit was conducted virtually. This visit type was conducted due to national recommendations for restrictions regarding the COVID-19 Pandemic (e.g. social distancing, sheltering in place) in an effort to limit this patient's exposure and mitigate transmission in our community. All issues noted in this document were discussed and addressed.  A physical exam was not performed with this format.  I connected with Olivia Vance on 04/12/20 at 12:00 by telephone and verified that I am speaking with the correct person using two identifiers. Olivia Vance is currently located at home and no one is currently with  her during visit. The provider, Mary-Margaret Hassell Done, FNP is located in their office at time of visit.  I discussed the limitations, risks, security and privacy concerns of performing an evaluation and management service by telephone and the availability of in person appointments. I also discussed with the patient that there may be a patient responsible charge related to this service. The patient expressed understanding and agreed to proceed.   History and Present Illness:   Chief Complaint: sore throat, congestion  HPI Patient calls in c/o sore throat that started 4 day ago and now she is congested with greenish sinus drainage. Slight cough that is nonproductive.     Review of Systems  Constitutional: Negative for chills and fever.  HENT: Positive for congestion, sinus pain and sore throat. Negative for ear discharge and ear pain.   Respiratory: Positive for cough. Negative for sputum production.   Neurological: Positive for headaches. Negative for dizziness.     Observations/Objective: Alert and oriented- answers all questions appropriately No distress Voice hoarse   Assessment and Plan: Olivia Vance in today with chief complaint of URI   1. Acute maxillary sinusitis, recurrence not specified 1. Take meds  as prescribed 2. Use a cool mist humidifier especially during the winter months and when heat has been humid. 3. Use saline nose sprays frequently 4. Saline irrigations of the nose can be very helpful if done frequently.  * 4X daily for 1 week*  * Use of a nettie pot can be helpful with this. Follow directions with this* 5. Drink plenty of fluids 6. Keep thermostat turn down low 7.For any cough or congestion  Use plain Mucinex- regular strength or max strength is fine   * Children- consult with Pharmacist for dosing 8. For fever or aces or pains- take tylenol or ibuprofen appropriate for age and weight.  * for fevers greater than 101 orally you may alternate ibuprofen and tylenol every  3 hours.    - fluconazole (DIFLUCAN) 150 MG tablet; Take 1 tablet (150 mg total) by mouth once for 1 dose.  Dispense: 1 tablet; Refill: 0 - amoxicillin-clavulanate (AUGMENTIN) 875-125 MG tablet; Take 1 tablet by mouth 2 (two) times daily.  Dispense: 14 tablet; Refill: 0     Follow Up Instructions: prn    I discussed the assessment and treatment plan with the patient. The patient was provided an opportunity to ask questions and all were answered. The patient agreed with the plan and demonstrated an understanding of the instructions.   The patient was advised to call back or seek an in-person evaluation if the symptoms worsen or if the condition fails to improve as anticipated.  The above assessment and management plan was discussed with the patient. The patient verbalized understanding of and has agreed to the management plan. Patient is aware to call the clinic if symptoms  persist or worsen. Patient is aware when to return to the clinic for a follow-up visit. Patient educated on when it is appropriate to go to the emergency department.   Time call ended:  12:10  I provided 10 minutes of non-face-to-face time during this encounter.    Mary-Margaret Hassell Done, FNP

## 2020-05-27 ENCOUNTER — Ambulatory Visit (INDEPENDENT_AMBULATORY_CARE_PROVIDER_SITE_OTHER): Payer: BC Managed Care – PPO | Admitting: Nurse Practitioner

## 2020-05-27 ENCOUNTER — Encounter: Payer: Self-pay | Admitting: Nurse Practitioner

## 2020-05-27 DIAGNOSIS — K641 Second degree hemorrhoids: Secondary | ICD-10-CM

## 2020-05-27 MED ORDER — HYDROCORTISONE ACETATE 25 MG RE SUPP: 25.0000 mg | Freq: Two times a day (BID) | RECTAL | 0 refills | Status: DC

## 2020-05-27 NOTE — Progress Notes (Signed)
° °  Virtual Visit via telephone Note Due to COVID-19 pandemic this visit was conducted virtually. This visit type was conducted due to national recommendations for restrictions regarding the COVID-19 Pandemic (e.g. social distancing, sheltering in place) in an effort to limit this patient's exposure and mitigate transmission in our community. All issues noted in this document were discussed and addressed.  A physical exam was not performed with this format.  I connected with Olivia Vance on 05/27/20 at 1:00 by telephone and verified that I am speaking with the correct person using two identifiers. Olivia Vance is currently located at home and her husband is currently with her during visit. The provider, Mary-Margaret Hassell Done, FNP is located in their office at time of visit.  I discussed the limitations, risks, security and privacy concerns of performing an evaluation and management service by telephone and the availability of in person appointments. I also discussed with the patient that there may be a patient responsible charge related to this service. The patient expressed understanding and agreed to proceed.   History and Present Illness:   Chief Complaint: Hemorrhoids   HPI Patent calls in C/o flare up of her hemorrhoids. She had flare  Up 3 years ago. She is not sure what caused flare up. Some bleeding   Review of Systems  Constitutional: Negative for diaphoresis and weight loss.  Eyes: Negative for blurred vision, double vision and pain.  Respiratory: Negative for shortness of breath.   Cardiovascular: Negative for chest pain, palpitations, orthopnea and leg swelling.  Gastrointestinal: Negative for abdominal pain.  Skin: Negative for rash.  Neurological: Negative for dizziness, sensory change, loss of consciousness, weakness and headaches.  Endo/Heme/Allergies: Negative for polydipsia. Does not bruise/bleed easily.  Psychiatric/Behavioral: Negative for memory loss. The patient  does not have insomnia.   All other systems reviewed and are negative.    Observations/Objective: Alert and oriented- answers all questions appropriately No distress    Assessment and Plan: Olivia Vance in today with chief complaint of Hemorrhoids   1. Grade II hemorrhoids Stools softners Avoid straining to have bowel movement Meds ordered this encounter  Medications   hydrocortisone (ANUSOL-HC) 25 MG suppository    Sig: Place 1 suppository (25 mg total) rectally 2 (two) times daily.    Dispense:  12 suppository    Refill:  0    Order Specific Question:   Supervising Provider    Answer:   Caryl Pina A [[id number]]        Follow Up Instructions: prn    I discussed the assessment and treatment plan with the patient. The patient was provided an opportunity to ask questions and all were answered. The patient agreed with the plan and demonstrated an understanding of the instructions.   The patient was advised to call back or seek an in-person evaluation if the symptoms worsen or if the condition fails to improve as anticipated.  The above assessment and management plan was discussed with the patient. The patient verbalized understanding of and has agreed to the management plan. Patient is aware to call the clinic if symptoms persist or worsen. Patient is aware when to return to the clinic for a follow-up visit. Patient educated on when it is appropriate to go to the emergency department.   Time call ended:  1:15  I provided 15 minutes of non-face-to-face time during this encounter.    Mary-Margaret Hassell Done, FNP

## 2021-06-23 ENCOUNTER — Other Ambulatory Visit: Payer: Self-pay

## 2021-06-23 ENCOUNTER — Encounter: Payer: Self-pay | Admitting: Family Medicine

## 2021-06-23 ENCOUNTER — Ambulatory Visit (INDEPENDENT_AMBULATORY_CARE_PROVIDER_SITE_OTHER): Payer: BC Managed Care – PPO | Admitting: Family Medicine

## 2021-06-23 VITALS — BP 115/83 | HR 82 | Temp 97.8°F | Resp 20 | Ht 65.0 in | Wt 140.0 lb

## 2021-06-23 DIAGNOSIS — E538 Deficiency of other specified B group vitamins: Secondary | ICD-10-CM | POA: Diagnosis not present

## 2021-06-23 DIAGNOSIS — Z114 Encounter for screening for human immunodeficiency virus [HIV]: Secondary | ICD-10-CM

## 2021-06-23 DIAGNOSIS — Z0001 Encounter for general adult medical examination with abnormal findings: Secondary | ICD-10-CM

## 2021-06-23 DIAGNOSIS — Z1159 Encounter for screening for other viral diseases: Secondary | ICD-10-CM

## 2021-06-23 DIAGNOSIS — Z0000 Encounter for general adult medical examination without abnormal findings: Secondary | ICD-10-CM

## 2021-06-23 DIAGNOSIS — K900 Celiac disease: Secondary | ICD-10-CM | POA: Diagnosis not present

## 2021-06-23 DIAGNOSIS — F411 Generalized anxiety disorder: Secondary | ICD-10-CM | POA: Diagnosis not present

## 2021-06-23 DIAGNOSIS — K219 Gastro-esophageal reflux disease without esophagitis: Secondary | ICD-10-CM

## 2021-06-23 DIAGNOSIS — D649 Anemia, unspecified: Secondary | ICD-10-CM

## 2021-06-23 NOTE — Progress Notes (Signed)
Subjective:  Patient ID: Olivia Vance, female    DOB: 06/16/1981, 40 y.o.   MRN: [id number]  Patient Care Team: Baruch Gouty, FNP as PCP - General (Family Medicine) Gala Romney Cristopher Estimable, MD (Gastroenterology)   Chief Complaint:  Annual Exam (CPE - no pap )   HPI: Olivia Vance is a 40 y.o. female presenting on 06/23/2021 for Annual Exam (CPE - no pap )   Pt presents today for annual physical exam. She sees Vanessa Kick and had a normal PAP with negative HPV 09/2020. She had a full body scan by dermatology 08/2020, no abnormal findings. She states she has been doing very well. Did have a few days of anxiety last month but that subsided quickly. She is compliant with lexapro without adverse side effects. Celiac disease and GERD are well controlled with diet. Last labs 2018.  Relevant past medical, surgical, family, and social history reviewed and updated as indicated.  Allergies and medications reviewed and updated. Data reviewed: Chart in Epic.   Past Medical History:  Diagnosis Date   Celiac disease 2007   Biopsy proven.   Previous pregnancy with HELLP syndrome, antepartum 03/2011    Past Surgical History:  Procedure Laterality Date   CESAREAN SECTION N/A 07/03/2014   Procedure: CESAREAN SECTION;  Surgeon: Daria Pastures, MD;  Location: [facility];  Service: Obstetrics;  Laterality: N/A;   ESOPHAGOGASTRODUODENOSCOPY  06/2006   small hh, pale duodenal bulb, 2nd and 3rd portions of duodenum abnormal appearing. Bx c/w celiac disease. Positive serologies.    Social History   Socioeconomic History   Marital status: Married    Spouse name: Not on file   Number of children: 1   Years of education: Not on file   Highest education level: Not on file  Occupational History   Occupation: Pharmacist, hospital    Employer: [employer]  Tobacco Use   Smoking status: Former    Packs/day: 1.00    Types: Cigarettes   Smokeless tobacco: Never   Tobacco comments:    quit last sumer   Vaping Use   Vaping Use: Never used  Substance and Sexual Activity   Alcohol use: No   Drug use: No   Sexual activity: Yes    Birth control/protection: None  Other Topics Concern   Not on file  Social History Narrative   03/2011, daughter born   Social Determinants of Health   Financial Resource Strain: Not on file  Food Insecurity: Not on file  Transportation Needs: Not on file  Physical Activity: Not on file  Stress: Not on file  Social Connections: Not on file  Intimate Partner Violence: Not on file    Outpatient Encounter Medications as of 06/23/2021  Medication Sig   escitalopram (LEXAPRO) 20 MG tablet Take 20 mg by mouth daily.   [DISCONTINUED] amoxicillin-clavulanate (AUGMENTIN) 875-125 MG tablet Take 1 tablet by mouth 2 (two) times daily.   [DISCONTINUED] hydrocortisone (ANUSOL-HC) 25 MG suppository Place 1 suppository (25 mg total) rectally 2 (two) times daily.   No facility-administered encounter medications on file as of 06/23/2021.    No Known Allergies  Review of Systems  Constitutional:  Negative for activity change, appetite change, chills, diaphoresis, fatigue, fever and unexpected weight change.  HENT: Negative.    Eyes: Negative.   Respiratory:  Negative for cough, chest tightness and shortness of breath.   Cardiovascular:  Negative for chest pain, palpitations and leg swelling.  Gastrointestinal:  Negative for abdominal distention,  abdominal pain, anal bleeding, blood in stool, constipation, diarrhea, nausea, rectal pain and vomiting.  Endocrine: Negative.   Genitourinary:  Negative for decreased urine volume, difficulty urinating, dysuria, frequency, urgency, vaginal bleeding, vaginal discharge and vaginal pain.  Musculoskeletal:  Negative for arthralgias and myalgias.  Skin: Negative.   Allergic/Immunologic: Negative.   Neurological:  Negative for dizziness and headaches.  Hematological: Negative.   Psychiatric/Behavioral:  Negative for agitation,  confusion, decreased concentration, dysphoric mood, hallucinations, self-injury, sleep disturbance and suicidal ideas. The patient is nervous/anxious. The patient is not hyperactive.   All other systems reviewed and are negative.      Objective:  BP 115/83   Pulse 82   Temp 97.8 F (36.6 C)   Resp 20   Ht '5\' 5"'  (1.651 m)   Wt 140 lb (63.5 kg)   LMP 06/16/2021 (Approximate)   SpO2 98%   BMI 23.30 kg/m    Wt Readings from Last 3 Encounters:  06/23/21 140 lb (63.5 kg)  12/09/18 145 lb 8 oz (66 kg)  09/09/18 147 lb (66.7 kg)    Physical Exam Vitals and nursing note reviewed.  Constitutional:      General: She is not in acute distress.    Appearance: Normal appearance. She is well-developed, well-groomed and normal weight. She is not ill-appearing, toxic-appearing or diaphoretic.  HENT:     Head: Normocephalic and atraumatic.     Jaw: There is normal jaw occlusion.     Right Ear: Hearing, tympanic membrane, ear canal and external ear normal.     Left Ear: Hearing, tympanic membrane, ear canal and external ear normal.     Nose: Nose normal.     Mouth/Throat:     Lips: Pink.     Mouth: Mucous membranes are moist.     Pharynx: Oropharynx is clear. Uvula midline.  Eyes:     General: Lids are normal.     Extraocular Movements: Extraocular movements intact.     Conjunctiva/sclera: Conjunctivae normal.     Pupils: Pupils are equal, round, and reactive to light.  Neck:     Thyroid: No thyroid mass, thyromegaly or thyroid tenderness.     Vascular: No carotid bruit or JVD.     Trachea: Trachea and phonation normal.  Cardiovascular:     Rate and Rhythm: Normal rate and regular rhythm.     Chest Wall: PMI is not displaced.     Pulses: Normal pulses.     Heart sounds: Normal heart sounds. No murmur heard.   No friction rub. No gallop.  Pulmonary:     Effort: Pulmonary effort is normal. No respiratory distress.     Breath sounds: Normal breath sounds. No stridor. No wheezing or  rales.  Abdominal:     General: Abdomen is flat. Bowel sounds are normal. There is no distension or abdominal bruit.     Palpations: Abdomen is soft. There is no hepatomegaly or splenomegaly.     Tenderness: There is no abdominal tenderness. There is no right CVA tenderness or left CVA tenderness.     Hernia: No hernia is present.  Genitourinary:    Comments: Deferred, sees GYN Musculoskeletal:        General: Normal range of motion.     Cervical back: Normal range of motion and neck supple.     Right lower leg: No edema.     Left lower leg: No edema.  Lymphadenopathy:     Cervical: No cervical adenopathy.  Skin:    General:  Skin is warm and dry.     Capillary Refill: Capillary refill takes less than 2 seconds.     Coloration: Skin is not cyanotic, jaundiced or pale.     Findings: No rash.  Neurological:     General: No focal deficit present.     Mental Status: She is alert and oriented to person, place, and time.     Cranial Nerves: Cranial nerves are intact. No cranial nerve deficit.     Sensory: Sensation is intact. No sensory deficit.     Motor: Motor function is intact. No weakness.     Coordination: Coordination is intact. Coordination normal.     Gait: Gait is intact. Gait normal.     Deep Tendon Reflexes: Reflexes are normal and symmetric. Reflexes normal.  Psychiatric:        Attention and Perception: Attention and perception normal.        Mood and Affect: Mood and affect normal.        Speech: Speech normal.        Behavior: Behavior normal. Behavior is cooperative.        Thought Content: Thought content normal.        Cognition and Memory: Cognition and memory normal.        Judgment: Judgment normal.    Results for orders placed or performed during the hospital encounter of 05/01/17  CBC with Differential  Result Value Ref Range   WBC 4.8 4.0 - 10.5 K/uL   RBC 4.19 3.87 - 5.11 MIL/uL   Hemoglobin 10.9 (L) 12.0 - 15.0 g/dL   HCT 34.2 (L) 36.0 - 46.0 %   MCV  81.6 78.0 - 100.0 fL   MCH 26.0 26.0 - 34.0 pg   MCHC 31.9 30.0 - 36.0 g/dL   RDW 16.3 (H) 11.5 - 15.5 %   Platelets 183 150 - 400 K/uL   Neutrophils Relative % 54 %   Neutro Abs 2.6 1.7 - 7.7 K/uL   Lymphocytes Relative 37 %   Lymphs Abs 1.8 0.7 - 4.0 K/uL   Monocytes Relative 8 %   Monocytes Absolute 0.4 0.1 - 1.0 K/uL   Eosinophils Relative 1 %   Eosinophils Absolute 0.0 0.0 - 0.7 K/uL   Basophils Relative 0 %   Basophils Absolute 0.0 0.0 - 0.1 K/uL  Comprehensive metabolic panel  Result Value Ref Range   Sodium 136 135 - 145 mmol/L   Potassium 4.2 3.5 - 5.1 mmol/L   Chloride 106 101 - 111 mmol/L   CO2 22 22 - 32 mmol/L   Glucose, Bld 101 (H) 65 - 99 mg/dL   BUN 10 6 - 20 mg/dL   Creatinine, Ser 0.73 0.44 - 1.00 mg/dL   Calcium 8.6 (L) 8.9 - 10.3 mg/dL   Total Protein 6.1 (L) 6.5 - 8.1 g/dL   Albumin 3.8 3.5 - 5.0 g/dL   AST 30 15 - 41 U/L   ALT 32 14 - 54 U/L   Alkaline Phosphatase 61 38 - 126 U/L   Total Bilirubin 0.4 0.3 - 1.2 mg/dL   GFR calc non Af Amer >60 >60 mL/min   GFR calc Af Amer >60 >60 mL/min   Anion gap 8 5 - 15  I-Stat Beta hCG blood, ED ([facility], [facility], [facility] only)  Result Value Ref Range   I-stat hCG, quantitative <5.0 <5 mIU/mL   Comment 3               Pertinent labs & imaging  results that were available during my care of the patient were reviewed by me and considered in my medical decision making.  Assessment & Plan:  Catelin was seen today for annual exam.  Diagnoses and all orders for this visit:  Annual physical exam Health maintenance discussed in detail. Had PAP 09/2020, will obtain results. Had full body scan by dermatology 08/2020. Will start mammograms at age 55 and colonoscopy at age 59 unless problems arise. Labs pending.  -     CMP14+EGFR -     Lipid panel -     Thyroid Panel With TSH -     Vitamin B12 -     VITAMIN D 25 Hydroxy (Vit-D Deficiency, Fractures) -     CBC with Differential/Platelet -     Hepatitis C antibody -     HIV  Antibody (routine testing w rflx)  GAD (generalized anxiety disorder) Well controlled on current regimen. Will check thyroid function.  -     Thyroid Panel With TSH  Celiac disease Well controlled with diet. Will check below labs.  -     CMP14+EGFR -     Vitamin B12 -     VITAMIN D 25 Hydroxy (Vit-D Deficiency, Fractures) -     CBC with Differential/Platelet  B12 deficiency Will check levels today and place on repletion therapy if warranted.  -     Vitamin B12  Gastroesophageal reflux disease without esophagitis Well controlled with diet.  -     CBC with Differential/Platelet  Screening for HIV (human immunodeficiency virus) -     HIV Antibody (routine testing w rflx)  Need for hepatitis C screening test -     Hepatitis C antibody     Continue all other maintenance medications.  Follow up plan: Return in about 1 year (around 06/23/2022), or if symptoms worsen or fail to improve.   Continue healthy lifestyle choices, including diet (rich in fruits, vegetables, and lean proteins, and low in salt and simple carbohydrates) and exercise (at least 30 minutes of moderate physical activity daily).  Educational handout given for health maintenance   The above assessment and management plan was discussed with the patient. The patient verbalized understanding of and has agreed to the management plan. Patient is aware to call the clinic if they develop any new symptoms or if symptoms persist or worsen. Patient is aware when to return to the clinic for a follow-up visit. Patient educated on when it is appropriate to go to the emergency department.   Monia Pouch, FNP-C [facility] [phone]

## 2021-06-24 LAB — CMP14+EGFR
ALT: 26 IU/L (ref 0–32)
AST: 23 IU/L (ref 0–40)
Albumin/Globulin Ratio: 2.4 — ABNORMAL HIGH (ref 1.2–2.2)
Albumin: 4.4 g/dL (ref 3.8–4.8)
Alkaline Phosphatase: 89 IU/L (ref 44–121)
BUN/Creatinine Ratio: 8 — ABNORMAL LOW (ref 9–23)
BUN: 6 mg/dL (ref 6–20)
Bilirubin Total: 0.3 mg/dL (ref 0.0–1.2)
CO2: 20 mmol/L (ref 20–29)
Calcium: 9.3 mg/dL (ref 8.7–10.2)
Chloride: 101 mmol/L (ref 96–106)
Creatinine, Ser: 0.72 mg/dL (ref 0.57–1.00)
Globulin, Total: 1.8 g/dL (ref 1.5–4.5)
Glucose: 85 mg/dL (ref 65–99)
Potassium: 4.9 mmol/L (ref 3.5–5.2)
Sodium: 135 mmol/L (ref 134–144)
Total Protein: 6.2 g/dL (ref 6.0–8.5)
eGFR: 110 mL/min/{1.73_m2} (ref >59)

## 2021-06-24 LAB — CBC WITH DIFFERENTIAL/PLATELET
Basophils Absolute: 0 10*3/uL (ref 0.0–0.2)
Basos: 1 %
EOS (ABSOLUTE): 0 10*3/uL (ref 0.0–0.4)
Eos: 1 %
Hematocrit: 34.1 % (ref 34.0–46.6)
Hemoglobin: 10.6 g/dL — ABNORMAL LOW (ref 11.1–15.9)
Immature Grans (Abs): 0 10*3/uL (ref 0.0–0.1)
Immature Granulocytes: 0 %
Lymphocytes Absolute: 1.1 10*3/uL (ref 0.7–3.1)
Lymphs: 42 %
MCH: 23.9 pg — ABNORMAL LOW (ref 26.6–33.0)
MCHC: 31.1 g/dL — ABNORMAL LOW (ref 31.5–35.7)
MCV: 77 fL — ABNORMAL LOW (ref 79–97)
Monocytes Absolute: 0.3 10*3/uL (ref 0.1–0.9)
Monocytes: 11 %
Neutrophils Absolute: 1.2 10*3/uL — ABNORMAL LOW (ref 1.4–7.0)
Neutrophils: 45 %
Platelets: 200 10*3/uL (ref 150–450)
RBC: 4.43 x10E6/uL (ref 3.77–5.28)
RDW: 15.9 % — ABNORMAL HIGH (ref 11.7–15.4)
WBC: 2.7 10*3/uL — ABNORMAL LOW (ref 3.4–10.8)

## 2021-06-24 LAB — LIPID PANEL
Chol/HDL Ratio: 4.1 ratio (ref 0.0–4.4)
Cholesterol, Total: 150 mg/dL (ref 100–199)
HDL: 37 mg/dL — ABNORMAL LOW (ref >39)
LDL Chol Calc (NIH): 96 mg/dL (ref 0–99)
Triglycerides: 92 mg/dL (ref 0–149)
VLDL Cholesterol Cal: 17 mg/dL (ref 5–40)

## 2021-06-24 LAB — THYROID PANEL WITH TSH
Free Thyroxine Index: 2 (ref 1.2–4.9)
T3 Uptake Ratio: 26 % (ref 24–39)
T4, Total: 7.7 ug/dL (ref 4.5–12.0)
TSH: 2.66 u[IU]/mL (ref 0.450–4.500)

## 2021-06-24 LAB — HIV ANTIBODY (ROUTINE TESTING W REFLEX): HIV Screen 4th Generation wRfx: NONREACTIVE

## 2021-06-24 LAB — HEPATITIS C ANTIBODY: Hep C Virus Ab: <0.1 s/co ratio (ref 0.0–0.9)

## 2021-06-24 LAB — VITAMIN D 25 HYDROXY (VIT D DEFICIENCY, FRACTURES): Vit D, 25-Hydroxy: 61.3 ng/mL (ref 30.0–100.0)

## 2021-06-24 LAB — VITAMIN B12: Vitamin B-12: 307 pg/mL (ref 232–1245)

## 2021-06-24 MED ORDER — IRON (FERROUS SULFATE) 325 (65 FE) MG PO TABS: 325.0000 mg | ORAL_TABLET | Freq: Every day | ORAL | 6 refills | Status: DC

## 2021-06-24 NOTE — Addendum Note (Signed)
Addended by: Baruch Gouty on: 06/24/2021 09:05 AM   Modules accepted: Orders

## 2021-08-05 ENCOUNTER — Encounter: Payer: Self-pay | Admitting: Family Medicine

## 2021-08-05 ENCOUNTER — Ambulatory Visit: Payer: BC Managed Care – PPO | Admitting: Family Medicine

## 2021-08-05 ENCOUNTER — Other Ambulatory Visit: Payer: Self-pay

## 2021-08-05 VITALS — BP 115/76 | HR 91 | Temp 97.8°F | Ht 65.0 in | Wt 140.0 lb

## 2021-08-05 DIAGNOSIS — S39012A Strain of muscle, fascia and tendon of lower back, initial encounter: Secondary | ICD-10-CM

## 2021-08-05 DIAGNOSIS — R109 Unspecified abdominal pain: Secondary | ICD-10-CM

## 2021-08-05 LAB — URINALYSIS, ROUTINE W REFLEX MICROSCOPIC
Bilirubin, UA: NEGATIVE
Glucose, UA: NEGATIVE
Ketones, UA: NEGATIVE
Leukocytes,UA: NEGATIVE
Nitrite, UA: NEGATIVE
Protein,UA: NEGATIVE
RBC, UA: NEGATIVE
Specific Gravity, UA: <1.005 — ABNORMAL LOW (ref 1.005–1.030)
Urobilinogen, Ur: 0.2 mg/dL (ref 0.2–1.0)
pH, UA: 6 (ref 5.0–7.5)

## 2021-08-05 MED ORDER — METHYLPREDNISOLONE ACETATE 80 MG/ML IJ SUSP
80.0000 mg | Freq: Once | INTRAMUSCULAR | Status: AC
  Administered 2021-08-05: 60 mg via INTRAMUSCULAR

## 2021-08-05 NOTE — Progress Notes (Signed)
Subjective:  Patient ID: Olivia Vance, female    DOB: April 20, 1981, 40 y.o.   MRN: [id number]  Patient Care Team: Baruch Gouty, FNP as PCP - General (Family Medicine) Gala Romney Cristopher Estimable, MD (Gastroenterology)   Chief Complaint:  left side flank pain (Started this morning, 5-6/10 pain scale)   HPI: Olivia Vance is a 40 y.o. female presenting on 08/05/2021 for left side flank pain (Started this morning, 5-6/10 pain scale)   Pt presents today with complaints of left flank and left lower back pain. States she noticed it at 0300 this morning. States the pain is sharp to stabbing and worse with twisting and stretching arm over head. She denies injury. She has taken advil with relief of symptoms. No reported urinary symptoms. No shortness or breath or cough. No abdominal pain, n/v/d.    Relevant past medical, surgical, family, and social history reviewed and updated as indicated.  Allergies and medications reviewed and updated. Data reviewed: Chart in Epic.   Past Medical History:  Diagnosis Date   Celiac disease 2007   Biopsy proven.   Previous pregnancy with HELLP syndrome, antepartum 03/2011    Past Surgical History:  Procedure Laterality Date   CESAREAN SECTION N/A 07/03/2014   Procedure: CESAREAN SECTION;  Surgeon: Daria Pastures, MD;  Location: [facility];  Service: Obstetrics;  Laterality: N/A;   ESOPHAGOGASTRODUODENOSCOPY  06/2006   small hh, pale duodenal bulb, 2nd and 3rd portions of duodenum abnormal appearing. Bx c/w celiac disease. Positive serologies.    Social History   Socioeconomic History   Marital status: Married    Spouse name: Not on file   Number of children: 1   Years of education: Not on file   Highest education level: Not on file  Occupational History   Occupation: Pharmacist, hospital    Employer: [employer]  Tobacco Use   Smoking status: Former    Packs/day: 1.00    Types: Cigarettes   Smokeless tobacco: Never   Tobacco comments:    quit  last sumer  Vaping Use   Vaping Use: Never used  Substance and Sexual Activity   Alcohol use: No   Drug use: No   Sexual activity: Yes    Birth control/protection: None  Other Topics Concern   Not on file  Social History Narrative   03/2011, daughter born   Social Determinants of Health   Financial Resource Strain: Not on file  Food Insecurity: Not on file  Transportation Needs: Not on file  Physical Activity: Not on file  Stress: Not on file  Social Connections: Not on file  Intimate Partner Violence: Not on file    Outpatient Encounter Medications as of 08/05/2021  Medication Sig   escitalopram (LEXAPRO) 20 MG tablet Take 20 mg by mouth daily.   Iron, Ferrous Sulfate, 325 (65 Fe) MG TABS Take 325 mg by mouth daily.   [EXPIRED] methylPREDNISolone acetate (DEPO-MEDROL) injection 80 mg    No facility-administered encounter medications on file as of 08/05/2021.    No Known Allergies  Review of Systems  Constitutional:  Negative for activity change, appetite change, chills, diaphoresis, fatigue, fever and unexpected weight change.  HENT: Negative.    Eyes: Negative.   Respiratory:  Negative for cough, chest tightness and shortness of breath.   Cardiovascular:  Negative for chest pain, palpitations and leg swelling.  Gastrointestinal:  Negative for blood in stool, constipation, diarrhea, nausea and vomiting.  Endocrine: Negative.   Genitourinary:  Positive  for flank pain. Negative for decreased urine volume, difficulty urinating, dysuria, frequency, hematuria and urgency.  Musculoskeletal:  Positive for myalgias. Negative for arthralgias, back pain, joint swelling, neck pain and neck stiffness.  Skin: Negative.   Allergic/Immunologic: Negative.   Neurological:  Negative for dizziness and headaches.  Hematological: Negative.   Psychiatric/Behavioral:  Negative for confusion, hallucinations, sleep disturbance and suicidal ideas.   All other systems reviewed and are  negative.      Objective:  BP 115/76   Pulse 91   Temp 97.8 F (36.6 C)   Ht _0  (1.651 m)   Wt 140 lb (63.5 kg)   LMP 07/15/2021 (Approximate)   SpO2 97%   BMI 23.30 kg/m    Wt Readings from Last 3 Encounters:  08/05/21 140 lb (63.5 kg)  06/23/21 140 lb (63.5 kg)  12/09/18 145 lb 8 oz (66 kg)    Physical Exam Vitals and nursing note reviewed.  Constitutional:      General: She is not in acute distress.    Appearance: Normal appearance. She is well-developed, well-groomed and normal weight. She is not ill-appearing, toxic-appearing or diaphoretic.  HENT:     Head: Normocephalic and atraumatic.     Jaw: There is normal jaw occlusion.     Right Ear: Hearing normal.     Left Ear: Hearing normal.     Nose: Nose normal.     Mouth/Throat:     Lips: Pink.     Mouth: Mucous membranes are moist.     Pharynx: Oropharynx is clear. Uvula midline.  Eyes:     General: Lids are normal.     Extraocular Movements: Extraocular movements intact.     Conjunctiva/sclera: Conjunctivae normal.     Pupils: Pupils are equal, round, and reactive to light.  Neck:     Thyroid: No thyroid mass, thyromegaly or thyroid tenderness.     Vascular: No carotid bruit or JVD.     Trachea: Trachea and phonation normal.  Cardiovascular:     Rate and Rhythm: Normal rate and regular rhythm.     Chest Wall: PMI is not displaced.     Pulses: Normal pulses.     Heart sounds: Normal heart sounds. No murmur heard.   No friction rub. No gallop.  Pulmonary:     Effort: Pulmonary effort is normal. No respiratory distress.     Breath sounds: Normal breath sounds. No wheezing.  Abdominal:     General: Bowel sounds are normal. There is no distension or abdominal bruit.     Palpations: Abdomen is soft. There is no hepatomegaly or splenomegaly.     Tenderness: There is no abdominal tenderness. There is no right CVA tenderness or left CVA tenderness.     Hernia: No hernia is present.  Musculoskeletal:         General: Normal range of motion.     Cervical back: Normal, normal range of motion and neck supple.     Thoracic back: Normal.     Lumbar back: Tenderness present. No swelling, edema, deformity, signs of trauma, lacerations, spasms or bony tenderness. Normal range of motion. Negative right straight leg raise test and negative left straight leg raise test. No scoliosis.       Back:     Right hip: Normal.     Left hip: Normal.     Right lower leg: No edema.     Left lower leg: No edema.  Lymphadenopathy:     Cervical: No cervical  adenopathy.  Skin:    General: Skin is warm and dry.     Capillary Refill: Capillary refill takes less than 2 seconds.     Coloration: Skin is not cyanotic, jaundiced or pale.     Findings: No rash.  Neurological:     General: No focal deficit present.     Mental Status: She is alert and oriented to person, place, and time.     Cranial Nerves: Cranial nerves are intact.     Sensory: Sensation is intact.     Motor: Motor function is intact.     Coordination: Coordination is intact.     Gait: Gait is intact.     Deep Tendon Reflexes: Reflexes are normal and symmetric.  Psychiatric:        Attention and Perception: Attention and perception normal.        Mood and Affect: Mood and affect normal.        Speech: Speech normal.        Behavior: Behavior normal. Behavior is cooperative.        Thought Content: Thought content normal.        Cognition and Memory: Cognition and memory normal.        Judgment: Judgment normal.    Results for orders placed or performed in visit on 06/23/21  CMP14+EGFR  Result Value Ref Range   Glucose 85 65 - 99 mg/dL   BUN 6 6 - 20 mg/dL   Creatinine, Ser 0.72 0.57 - 1.00 mg/dL   eGFR 110 >59 mL/min/1.73   BUN/Creatinine Ratio 8 (L) 9 - 23   Sodium 135 134 - 144 mmol/L   Potassium 4.9 3.5 - 5.2 mmol/L   Chloride 101 96 - 106 mmol/L   CO2 20 20 - 29 mmol/L   Calcium 9.3 8.7 - 10.2 mg/dL   Total Protein 6.2 6.0 - 8.5 g/dL    Albumin 4.4 3.8 - 4.8 g/dL   Globulin, Total 1.8 1.5 - 4.5 g/dL   Albumin/Globulin Ratio 2.4 (H) 1.2 - 2.2   Bilirubin Total 0.3 0.0 - 1.2 mg/dL   Alkaline Phosphatase 89 44 - 121 IU/L   AST 23 0 - 40 IU/L   ALT 26 0 - 32 IU/L  Lipid panel  Result Value Ref Range   Cholesterol, Total 150 100 - 199 mg/dL   Triglycerides 92 0 - 149 mg/dL   HDL 37 (L) >39 mg/dL   VLDL Cholesterol Cal 17 5 - 40 mg/dL   LDL Chol Calc (NIH) 96 0 - 99 mg/dL   Chol/HDL Ratio 4.1 0.0 - 4.4 ratio  Thyroid Panel With TSH  Result Value Ref Range   TSH 2.660 0.450 - 4.500 uIU/mL   T4, Total 7.7 4.5 - 12.0 ug/dL   T3 Uptake Ratio 26 24 - 39 %   Free Thyroxine Index 2.0 1.2 - 4.9  Vitamin B12  Result Value Ref Range   Vitamin B-12 307 232 - 1,245 pg/mL  VITAMIN D 25 Hydroxy (Vit-D Deficiency, Fractures)  Result Value Ref Range   Vit D, 25-Hydroxy 61.3 30.0 - 100.0 ng/mL  CBC with Differential/Platelet  Result Value Ref Range   WBC 2.7 (L) 3.4 - 10.8 x10E3/uL   RBC 4.43 3.77 - 5.28 x10E6/uL   Hemoglobin 10.6 (L) 11.1 - 15.9 g/dL   Hematocrit 34.1 34.0 - 46.6 %   MCV 77 (L) 79 - 97 fL   MCH 23.9 (L) 26.6 - 33.0 pg   MCHC 31.1 (L) 31.5 -  35.7 g/dL   RDW 15.9 (H) 11.7 - 15.4 %   Platelets 200 150 - 450 x10E3/uL   Neutrophils 45 Not Estab. %   Lymphs 42 Not Estab. %   Monocytes 11 Not Estab. %   Eos 1 Not Estab. %   Basos 1 Not Estab. %   Neutrophils Absolute 1.2 (L) 1.4 - 7.0 x10E3/uL   Lymphocytes Absolute 1.1 0.7 - 3.1 x10E3/uL   Monocytes Absolute 0.3 0.1 - 0.9 x10E3/uL   EOS (ABSOLUTE) 0.0 0.0 - 0.4 x10E3/uL   Basophils Absolute 0.0 0.0 - 0.2 x10E3/uL   Immature Granulocytes 0 Not Estab. %   Immature Grans (Abs) 0.0 0.0 - 0.1 x10E3/uL  Hepatitis C antibody  Result Value Ref Range   Hep C Virus Ab <0.1 0.0 - 0.9 s/co ratio  HIV Antibody (routine testing w rflx)  Result Value Ref Range   HIV Screen 4th Generation wRfx Non Reactive Non Reactive     Urinalysis unremarkable in office.    Pertinent labs & imaging results that were available during my care of the patient were reviewed by me and considered in my medical decision making.  Assessment & Plan:  Donye was seen today for left side flank pain.  Diagnoses and all orders for this visit:  Strain of lumbar paraspinous muscle, initial encounter Strain of lumbar paraspinous muscle. Will burst with steroids in office. Symptomatic care discussed in detail. Report any new, worsening, or persistent symptoms.  -     methylPREDNISolone acetate (DEPO-MEDROL) injection 80 mg  Flank pain Urinalysis unremarkable in office. No indications of renal calculi.  -     Urinalysis, Routine w reflex microscopic    Continue all other maintenance medications.  Follow up plan: Return if symptoms worsen or fail to improve.   Continue healthy lifestyle choices, including diet (rich in fruits, vegetables, and lean proteins, and low in salt and simple carbohydrates) and exercise (at least 30 minutes of moderate physical activity daily).  Educational handout given for muscle strain  The above assessment and management plan was discussed with the patient. The patient verbalized understanding of and has agreed to the management plan. Patient is aware to call the clinic if they develop any new symptoms or if symptoms persist or worsen. Patient is aware when to return to the clinic for a follow-up visit. Patient educated on when it is appropriate to go to the emergency department.   Monia Pouch, FNP-C [facility] [phone]

## 2021-08-15 ENCOUNTER — Ambulatory Visit (INDEPENDENT_AMBULATORY_CARE_PROVIDER_SITE_OTHER): Payer: BC Managed Care – PPO | Admitting: Nurse Practitioner

## 2021-08-15 ENCOUNTER — Encounter: Payer: Self-pay | Admitting: Nurse Practitioner

## 2021-08-15 DIAGNOSIS — J0110 Acute frontal sinusitis, unspecified: Secondary | ICD-10-CM | POA: Diagnosis not present

## 2021-08-15 MED ORDER — AMOXICILLIN-POT CLAVULANATE 875-125 MG PO TABS: 1.0000 | ORAL_TABLET | Freq: Two times a day (BID) | ORAL | 0 refills | Status: DC

## 2021-08-15 NOTE — Progress Notes (Signed)
   Virtual Visit  Note Due to COVID-19 pandemic this visit was conducted virtually. This visit type was conducted due to national recommendations for restrictions regarding the COVID-19 Pandemic (e.g. social distancing, sheltering in place) in an effort to limit this patient's exposure and mitigate transmission in our community. All issues noted in this document were discussed and addressed.  A physical exam was not performed with this format.  I connected with Olivia Vance on 08/15/21 at 08:05 am by telephone and verified that I am speaking with the correct person using two identifiers. Olivia Vance is currently located at home during visit. The provider, Ivy Lynn, NP is located in their office at time of visit.  I discussed the limitations, risks, security and privacy concerns of performing an evaluation and management service by telephone and the availability of in person appointments. I also discussed with the patient that there may be a patient responsible charge related to this service. The patient expressed understanding and agreed to proceed.   History and Present Illness:  Sinusitis This is a new problem. Episode onset: in the past 3-4 days. The problem has been gradually worsening since onset. There has been no fever. The pain is moderate. Associated symptoms include congestion, headaches and sinus pressure. Pertinent negatives include no chills, coughing, shortness of breath or sore throat. Past treatments include nothing.     Review of Systems  Constitutional:  Negative for chills and fever.  HENT:  Positive for congestion, sinus pressure and sinus pain. Negative for sore throat.   Eyes:  Negative for discharge and redness.  Respiratory:  Negative for cough and shortness of breath.   Cardiovascular: Negative.   Skin:  Negative for rash.  Neurological:  Positive for headaches.  All other systems reviewed and are negative.   Observations/Objective: Tele-visit, patient  not in distress  Assessment and Plan: Take meds as prescribed - Use a cool mist humidifier  -Use saline nose sprays frequently -Force fluids -For fever or aches or pains- take Tylenol or ibuprofen. -Augmentin 875-125 mg by mouth daily -If symptoms do not improve, she may need to be COVID tested to rule this out Follow up with worsening unresolved symptoms   Follow Up Instructions: Follow up with worsening unresolved symptoms    I discussed the assessment and treatment plan with the patient. The patient was provided an opportunity to ask questions and all were answered. The patient agreed with the plan and demonstrated an understanding of the instructions.   The patient was advised to call back or seek an in-person evaluation if the symptoms worsen or if the condition fails to improve as anticipated.  The above assessment and management plan was discussed with the patient. The patient verbalized understanding of and has agreed to the management plan. Patient is aware to call the clinic if symptoms persist or worsen. Patient is aware when to return to the clinic for a follow-up visit. Patient educated on when it is appropriate to go to the emergency department.   Time call ended:  08:15  I provided 10 minutes of  non face-to-face time during this encounter.    Ivy Lynn, NP

## 2021-08-15 NOTE — Assessment & Plan Note (Signed)
Take meds as prescribed - Use a cool mist humidifier  -Use saline nose sprays frequently -Force fluids -For fever or aches or pains- take Tylenol or ibuprofen. -Augmentin 875-125 mg by mouth daily -If symptoms do not improve, she may need to be COVID tested to rule this out Follow up with worsening unresolved symptoms

## 2021-09-06 ENCOUNTER — Ambulatory Visit (INDEPENDENT_AMBULATORY_CARE_PROVIDER_SITE_OTHER): Payer: BC Managed Care – PPO | Admitting: Family Medicine

## 2021-09-06 ENCOUNTER — Encounter: Payer: Self-pay | Admitting: Family Medicine

## 2021-09-06 DIAGNOSIS — J0110 Acute frontal sinusitis, unspecified: Secondary | ICD-10-CM

## 2021-09-06 DIAGNOSIS — D229 Melanocytic nevi, unspecified: Secondary | ICD-10-CM | POA: Insufficient documentation

## 2021-09-06 DIAGNOSIS — D239 Other benign neoplasm of skin, unspecified: Secondary | ICD-10-CM | POA: Insufficient documentation

## 2021-09-06 MED ORDER — DOXYCYCLINE HYCLATE 100 MG PO TABS: 100.0000 mg | ORAL_TABLET | Freq: Two times a day (BID) | ORAL | 0 refills | Status: AC

## 2021-09-06 NOTE — Progress Notes (Signed)
Virtual Visit via telephone Note Due to COVID-19 pandemic this visit was conducted virtually. This visit type was conducted due to national recommendations for restrictions regarding the COVID-19 Pandemic (e.g. social distancing, sheltering in place) in an effort to limit this patient's exposure and mitigate transmission in our community. All issues noted in this document were discussed and addressed.  A physical exam was not performed with this format.   I connected with Olivia Vance on 09/06/2021 at 1135 by telephone and verified that I am speaking with the correct person using two identifiers. Olivia Vance is currently located at home and  no one  is currently with them during visit. The provider, Monia Pouch, FNP is located in their office at time of visit.  I discussed the limitations, risks, security and privacy concerns of performing an evaluation and management service by telephone and the availability of in person appointments. I also discussed with the patient that there may be a patient responsible charge related to this service. The patient expressed understanding and agreed to proceed.  Subjective:  Patient ID: Olivia Vance, female    DOB: 26-Mar-1981, 40 y.o.   MRN: [id number]  Chief Complaint:  Sinus Problem   HPI: Olivia Vance is a 40 y.o. female presenting on 09/06/2021 for Sinus Problem   Pt reports ongoing frontal sinus pressure for over 10 days. Has been using over the counter could and sinus medications along with flonase. No relief of symptoms.   Sinus Problem This is a new problem. The current episode started 1 to 4 weeks ago. The problem has been gradually worsening since onset. There has been no fever. Her pain is at a severity of 6/10. The pain is moderate. Associated symptoms include congestion, headaches and sinus pressure. Pertinent negatives include no chills, coughing, diaphoresis, ear pain, hoarse voice, neck pain, shortness of breath, sneezing, sore  throat or swollen glands. Past treatments include spray decongestants and acetaminophen. The treatment provided no relief.    Relevant past medical, surgical, family, and social history reviewed and updated as indicated.  Allergies and medications reviewed and updated.   Past Medical History:  Diagnosis Date   Celiac disease 2007   Biopsy proven.   Previous pregnancy with HELLP syndrome, antepartum 03/2011    Past Surgical History:  Procedure Laterality Date   CESAREAN SECTION N/A 07/03/2014   Procedure: CESAREAN SECTION;  Surgeon: Daria Pastures, MD;  Location: [facility];  Service: Obstetrics;  Laterality: N/A;   ESOPHAGOGASTRODUODENOSCOPY  06/2006   small hh, pale duodenal bulb, 2nd and 3rd portions of duodenum abnormal appearing. Bx c/w celiac disease. Positive serologies.    Social History   Socioeconomic History   Marital status: Married    Spouse name: Not on file   Number of children: 1   Years of education: Not on file   Highest education level: Not on file  Occupational History   Occupation: Pharmacist, hospital    Employer: [employer]  Tobacco Use   Smoking status: Former    Packs/day: 1.00    Types: Cigarettes   Smokeless tobacco: Never   Tobacco comments:    quit last sumer  Vaping Use   Vaping Use: Never used  Substance and Sexual Activity   Alcohol use: No   Drug use: No   Sexual activity: Yes    Birth control/protection: None  Other Topics Concern   Not on file  Social History Narrative   03/2011, daughter born   Social Determinants  of Health   Financial Resource Strain: Not on file  Food Insecurity: Not on file  Transportation Needs: Not on file  Physical Activity: Not on file  Stress: Not on file  Social Connections: Not on file  Intimate Partner Violence: Not on file    Outpatient Encounter Medications as of 09/06/2021  Medication Sig   doxycycline (VIBRA-TABS) 100 MG tablet Take 1 tablet (100 mg total) by mouth 2 (two) times daily for  10 days. 1 po bid   escitalopram (LEXAPRO) 20 MG tablet Take 20 mg by mouth daily.   Iron, Ferrous Sulfate, 325 (65 Fe) MG TABS Take 325 mg by mouth daily.   [DISCONTINUED] amoxicillin-clavulanate (AUGMENTIN) 875-125 MG tablet Take 1 tablet by mouth 2 (two) times daily.   No facility-administered encounter medications on file as of 09/06/2021.    No Known Allergies  Review of Systems  Constitutional:  Negative for activity change, appetite change, chills, diaphoresis, fatigue, fever and unexpected weight change.  HENT:  Positive for congestion, postnasal drip, rhinorrhea, sinus pressure and sinus pain. Negative for dental problem, drooling, ear discharge, ear pain, facial swelling, hearing loss, hoarse voice, mouth sores, nosebleeds, sneezing, sore throat, tinnitus, trouble swallowing and voice change.   Eyes: Negative.   Respiratory:  Negative for cough, chest tightness and shortness of breath.   Cardiovascular:  Negative for chest pain, palpitations and leg swelling.  Gastrointestinal:  Negative for abdominal pain, blood in stool, constipation, diarrhea, nausea and vomiting.  Endocrine: Negative.   Genitourinary:  Negative for decreased urine volume, difficulty urinating, dysuria, frequency and urgency.  Musculoskeletal:  Negative for arthralgias, myalgias and neck pain.  Skin: Negative.   Allergic/Immunologic: Negative.   Neurological:  Positive for headaches. Negative for dizziness, tremors, seizures, syncope, facial asymmetry, speech difficulty, weakness, light-headedness and numbness.  Hematological: Negative.   Psychiatric/Behavioral:  Negative for confusion, hallucinations, sleep disturbance and suicidal ideas.   All other systems reviewed and are negative.       Observations/Objective: No vital signs or physical exam, this was a telephone or virtual health encounter.  Pt alert and oriented, answers all questions appropriately, and able to speak in full sentences.     Assessment and Plan: Lenix was seen today for sinus problem.  Diagnoses and all orders for this visit:  Acute non-recurrent frontal sinusitis Ongoing symptoms for over 10 days. Failed symptomatic care at home. Will initiate doxycycline as prescribed. Continue Flonase and Mucinex. Report any new, worsening, or persistent symptoms.  -     doxycycline (VIBRA-TABS) 100 MG tablet; Take 1 tablet (100 mg total) by mouth 2 (two) times daily for 10 days. 1 po bid     Follow Up Instructions: Return if symptoms worsen or fail to improve.    I discussed the assessment and treatment plan with the patient. The patient was provided an opportunity to ask questions and all were answered. The patient agreed with the plan and demonstrated an understanding of the instructions.   The patient was advised to call back or seek an in-person evaluation if the symptoms worsen or if the condition fails to improve as anticipated.  The above assessment and management plan was discussed with the patient. The patient verbalized understanding of and has agreed to the management plan. Patient is aware to call the clinic if they develop any new symptoms or if symptoms persist or worsen. Patient is aware when to return to the clinic for a follow-up visit. Patient educated on when it is appropriate to go  to the emergency department.    I provided 12 minutes of non-face-to-face time during this encounter. The call started at 1135. The call ended at 1145. The other time was used for coordination of care.    Monia Pouch, FNP-C [facility] [address] [place], [place] [address] [phone] 09/06/2021

## 2022-04-10 ENCOUNTER — Telehealth: Payer: BC Managed Care – PPO | Admitting: Family Medicine

## 2022-08-10 ENCOUNTER — Ambulatory Visit: Payer: BC Managed Care – PPO | Admitting: Family Medicine

## 2022-08-10 ENCOUNTER — Encounter: Payer: Self-pay | Admitting: Family Medicine

## 2022-08-10 VITALS — BP 104/69 | HR 77 | Temp 98.0°F | Ht 65.0 in | Wt 142.4 lb

## 2022-08-10 DIAGNOSIS — Z1231 Encounter for screening mammogram for malignant neoplasm of breast: Secondary | ICD-10-CM

## 2022-08-10 DIAGNOSIS — D508 Other iron deficiency anemias: Secondary | ICD-10-CM | POA: Diagnosis not present

## 2022-08-10 DIAGNOSIS — K219 Gastro-esophageal reflux disease without esophagitis: Secondary | ICD-10-CM

## 2022-08-10 MED ORDER — FAMOTIDINE 40 MG PO TABS: 40.0000 mg | ORAL_TABLET | Freq: Every day | ORAL | 0 refills | Status: DC

## 2022-08-10 NOTE — Progress Notes (Signed)
Subjective:  Patient ID: Olivia Vance, female    DOB: 11-04-1980, 41 y.o.   MRN: [id number]  Patient Care Team: Baruch Gouty, FNP as PCP - General (Family Medicine) Gala Romney Cristopher Estimable, MD (Gastroenterology)   Chief Complaint:  Heartburn (Ongoing but has gotten worse. Using OTC tums )   HPI: Olivia Vance is a 41 y.o. female presenting on 08/10/2022 for Heartburn (Ongoing but has gotten worse. Using OTC tums )   1. Gastroesophageal reflux disease without esophagitis Pt reports worsening acid reflux symptoms over the last several months, has been taking Tums with some relief of symptoms. Has not tried any other medications. States she was on Nexium during her pregnancies but has not been on anything regular since then.  Cough - No Sore throat - No Voice change - No Hemoptysis - No Dysphagia or dyspepsia - No Water brash - No Red Flags (weight loss, hematochezia, melena, weight loss, early satiety, fevers, odynophagia, or persistent vomiting) - No   2. Iron deficiency anemia secondary to inadequate dietary iron intake No longer on iron repletion therapy. Denies melena, hematochezia, fatigue, chest pain, shortness of breath, palpitations, or dizziness.     Relevant past medical, surgical, family, and social history reviewed and updated as indicated.  Allergies and medications reviewed and updated. Data reviewed: Chart in Epic.   Past Medical History:  Diagnosis Date   Celiac disease 2007   Biopsy proven.   Previous pregnancy with HELLP syndrome, antepartum 03/2011    Past Surgical History:  Procedure Laterality Date   CESAREAN SECTION N/A 07/03/2014   Procedure: CESAREAN SECTION;  Surgeon: Daria Pastures, MD;  Location: [facility];  Service: Obstetrics;  Laterality: N/A;   ESOPHAGOGASTRODUODENOSCOPY  06/2006   small hh, pale duodenal bulb, 2nd and 3rd portions of duodenum abnormal appearing. Bx c/w celiac disease. Positive serologies.    Social History    Socioeconomic History   Marital status: Married    Spouse name: Not on file   Number of children: 1   Years of education: Not on file   Highest education level: Not on file  Occupational History   Occupation: Pharmacist, hospital    Employer: [employer]  Tobacco Use   Smoking status: Former    Packs/day: 1.00    Types: Cigarettes   Smokeless tobacco: Never   Tobacco comments:    quit last sumer  Vaping Use   Vaping Use: Never used  Substance and Sexual Activity   Alcohol use: No   Drug use: No   Sexual activity: Yes    Birth control/protection: None  Other Topics Concern   Not on file  Social History Narrative   03/2011, daughter born   Social Determinants of Health   Financial Resource Strain: Not on file  Food Insecurity: Not on file  Transportation Needs: Not on file  Physical Activity: Not on file  Stress: Not on file  Social Connections: Not on file  Intimate Partner Violence: Not on file    Outpatient Encounter Medications as of 08/10/2022  Medication Sig   escitalopram (LEXAPRO) 20 MG tablet Take 20 mg by mouth daily.   famotidine (PEPCID) 40 MG tablet Take 1 tablet (40 mg total) by mouth daily.   [DISCONTINUED] Iron, Ferrous Sulfate, 325 (65 Fe) MG TABS Take 325 mg by mouth daily. (Patient not taking: Reported on 08/10/2022)   No facility-administered encounter medications on file as of 08/10/2022.    No Known Allergies  Review of  Systems  Constitutional:  Negative for activity change, appetite change, chills, diaphoresis, fatigue, fever and unexpected weight change.  HENT:  Negative for congestion.   Eyes:  Negative for photophobia and visual disturbance.  Respiratory:  Negative for cough and shortness of breath.   Cardiovascular:  Negative for chest pain, palpitations and leg swelling.  Gastrointestinal:  Positive for abdominal pain. Negative for abdominal distention, anal bleeding, blood in stool, constipation, diarrhea, nausea, rectal pain and  vomiting.       GERD  Endocrine: Negative for cold intolerance, heat intolerance, polydipsia, polyphagia and polyuria.  Genitourinary:  Negative for decreased urine volume and difficulty urinating.  Musculoskeletal:  Negative for arthralgias and myalgias.  Neurological:  Negative for dizziness, tremors, seizures, syncope, facial asymmetry, speech difficulty, weakness, light-headedness, numbness and headaches.  Psychiatric/Behavioral:  Negative for confusion.   All other systems reviewed and are negative.       Objective:  BP 104/69   Pulse 77   Temp 98 F (36.7 C) (Temporal)   Ht '5\' 5"'$  (1.651 m)   Wt 142 lb 6.4 oz (64.6 kg)   LMP 07/14/2022   SpO2 100%   BMI 23.70 kg/m    Wt Readings from Last 3 Encounters:  08/10/22 142 lb 6.4 oz (64.6 kg)  08/05/21 140 lb (63.5 kg)  06/23/21 140 lb (63.5 kg)    Physical Exam Vitals and nursing note reviewed.  Constitutional:      General: She is not in acute distress.    Appearance: Normal appearance. She is well-developed and well-groomed. She is not ill-appearing, toxic-appearing or diaphoretic.  HENT:     Head: Normocephalic and atraumatic.     Jaw: There is normal jaw occlusion.     Right Ear: Hearing normal.     Left Ear: Hearing normal.     Nose: Nose normal.     Mouth/Throat:     Lips: Pink.     Mouth: Mucous membranes are moist.     Pharynx: Oropharynx is clear. Uvula midline.  Eyes:     General: Lids are normal.     Extraocular Movements: Extraocular movements intact.     Conjunctiva/sclera: Conjunctivae normal.     Pupils: Pupils are equal, round, and reactive to light.  Neck:     Thyroid: No thyroid mass, thyromegaly or thyroid tenderness.     Vascular: No carotid bruit or JVD.     Trachea: Trachea and phonation normal.  Cardiovascular:     Rate and Rhythm: Normal rate and regular rhythm.     Chest Wall: PMI is not displaced.     Pulses: Normal pulses.     Heart sounds: Normal heart sounds. No murmur heard.     No friction rub. No gallop.  Pulmonary:     Effort: Pulmonary effort is normal. No respiratory distress.     Breath sounds: Normal breath sounds. No wheezing.  Abdominal:     General: Bowel sounds are normal. There is no distension or abdominal bruit.     Palpations: Abdomen is soft. There is no hepatomegaly or splenomegaly.     Tenderness: There is no abdominal tenderness. There is no right CVA tenderness or left CVA tenderness.     Hernia: No hernia is present.  Musculoskeletal:        General: Normal range of motion.     Cervical back: Normal range of motion and neck supple.     Right lower leg: No edema.     Left lower leg: No  edema.  Lymphadenopathy:     Cervical: No cervical adenopathy.  Skin:    General: Skin is warm and dry.     Capillary Refill: Capillary refill takes less than 2 seconds.     Coloration: Skin is not cyanotic, jaundiced or pale.     Findings: No rash.  Neurological:     General: No focal deficit present.     Mental Status: She is alert and oriented to person, place, and time.     Sensory: Sensation is intact.     Motor: Motor function is intact.     Coordination: Coordination is intact.     Gait: Gait is intact.     Deep Tendon Reflexes: Reflexes are normal and symmetric.  Psychiatric:        Attention and Perception: Attention and perception normal.        Mood and Affect: Mood and affect normal.        Speech: Speech normal.        Behavior: Behavior normal. Behavior is cooperative.        Thought Content: Thought content normal.        Cognition and Memory: Cognition and memory normal.        Judgment: Judgment normal.     Results for orders placed or performed in visit on 08/05/21  Urinalysis, Routine w reflex microscopic  Result Value Ref Range   Specific Gravity, UA <1.005 (L) 1.005 - 1.030   pH, UA 6.0 5.0 - 7.5   Color, UA Yellow Yellow   Appearance Ur Clear Clear   Leukocytes,UA Negative Negative   Protein,UA Negative Negative/Trace    Glucose, UA Negative Negative   Ketones, UA Negative Negative   RBC, UA Negative Negative   Bilirubin, UA Negative Negative   Urobilinogen, Ur 0.2 0.2 - 1.0 mg/dL   Nitrite, UA Negative Negative       Pertinent labs & imaging results that were available during my care of the patient were reviewed by me and considered in my medical decision making.  Assessment & Plan:  Chrisandra was seen today for heartburn.  Diagnoses and all orders for this visit:  Gastroesophageal reflux disease without esophagitis No red flags present. Will start on Pepcid as prescribed. Follow up in 6 weeks for reevaluation. If symptoms persist or worsen, will refer to GI for possible endoscopy.  -     Anemia Profile B -     famotidine (PEPCID) 40 MG tablet; Take 1 tablet (40 mg total) by mouth daily.  Iron deficiency anemia secondary to inadequate dietary iron intake Will check labs today.  -     Anemia Profile B  Screening mammogram for breast cancer -     MM Digital Screening; Future     Continue all other maintenance medications.  Follow up plan: Return in about 6 weeks (around 09/21/2022), or if symptoms worsen or fail to improve, for GERD.   Continue healthy lifestyle choices, including diet (rich in fruits, vegetables, and lean proteins, and low in salt and simple carbohydrates) and exercise (at least 30 minutes of moderate physical activity daily).  Educational handout given for GERD  The above assessment and management plan was discussed with the patient. The patient verbalized understanding of and has agreed to the management plan. Patient is aware to call the clinic if they develop any new symptoms or if symptoms persist or worsen. Patient is aware when to return to the clinic for a follow-up visit. Patient educated on when  it is appropriate to go to the emergency department.   Monia Pouch, FNP-C [facility] [phone]

## 2022-08-11 ENCOUNTER — Encounter: Payer: Self-pay | Admitting: Family Medicine

## 2022-08-11 LAB — ANEMIA PROFILE B
Basophils Absolute: 0 10*3/uL (ref 0.0–0.2)
Basos: 1 %
EOS (ABSOLUTE): 0 10*3/uL (ref 0.0–0.4)
Eos: 1 %
Ferritin: 3 ng/mL — ABNORMAL LOW (ref 15–150)
Folate: 2.7 ng/mL — ABNORMAL LOW (ref >3.0)
Hematocrit: 31.1 % — ABNORMAL LOW (ref 34.0–46.6)
Hemoglobin: 9.3 g/dL — ABNORMAL LOW (ref 11.1–15.9)
Immature Grans (Abs): 0 10*3/uL (ref 0.0–0.1)
Immature Granulocytes: 0 %
Iron Saturation: 4 % — CL (ref 15–55)
Iron: 16 ug/dL — ABNORMAL LOW (ref 27–159)
Lymphocytes Absolute: 1.2 10*3/uL (ref 0.7–3.1)
Lymphs: 41 %
MCH: 22.5 pg — ABNORMAL LOW (ref 26.6–33.0)
MCHC: 29.9 g/dL — ABNORMAL LOW (ref 31.5–35.7)
MCV: 75 fL — ABNORMAL LOW (ref 79–97)
Monocytes Absolute: 0.3 10*3/uL (ref 0.1–0.9)
Monocytes: 12 %
Neutrophils Absolute: 1.3 10*3/uL — ABNORMAL LOW (ref 1.4–7.0)
Neutrophils: 45 %
Platelets: 224 10*3/uL (ref 150–450)
RBC: 4.14 x10E6/uL (ref 3.77–5.28)
RDW: 16.7 % — ABNORMAL HIGH (ref 11.7–15.4)
Retic Ct Pct: 1.1 % (ref 0.6–2.6)
Total Iron Binding Capacity: 399 ug/dL (ref 250–450)
UIBC: 383 ug/dL (ref 131–425)
Vitamin B-12: 383 pg/mL (ref 232–1245)
WBC: 2.9 10*3/uL — ABNORMAL LOW (ref 3.4–10.8)

## 2022-08-11 MED ORDER — IRON (FERROUS SULFATE) 325 (65 FE) MG PO TABS: 325.0000 mg | ORAL_TABLET | Freq: Every day | ORAL | 6 refills | Status: DC

## 2022-08-11 NOTE — Addendum Note (Signed)
Addended by: Baruch Gouty on: 08/11/2022 08:09 AM   Modules accepted: Orders

## 2022-08-14 ENCOUNTER — Other Ambulatory Visit: Payer: Self-pay | Admitting: Family Medicine

## 2022-08-14 DIAGNOSIS — K219 Gastro-esophageal reflux disease without esophagitis: Secondary | ICD-10-CM

## 2022-08-16 ENCOUNTER — Telehealth: Payer: Self-pay | Admitting: Family Medicine

## 2022-08-16 ENCOUNTER — Other Ambulatory Visit: Payer: Self-pay | Admitting: Family Medicine

## 2022-08-16 DIAGNOSIS — D508 Other iron deficiency anemias: Secondary | ICD-10-CM

## 2022-08-16 NOTE — Telephone Encounter (Signed)
Patient is wanting to check on referral for iron infusion. I do not see referral. Please advise

## 2022-08-17 NOTE — Telephone Encounter (Signed)
Patient aware referral has been placed.

## 2022-09-07 ENCOUNTER — Encounter: Payer: Self-pay | Admitting: Hematology

## 2022-09-07 ENCOUNTER — Inpatient Hospital Stay: Payer: BC Managed Care – PPO | Attending: Hematology | Admitting: Hematology

## 2022-09-07 VITALS — BP 107/74 | HR 99 | Temp 98.1°F | Resp 16 | Ht 65.0 in | Wt 139.5 lb

## 2022-09-07 DIAGNOSIS — D72819 Decreased white blood cell count, unspecified: Secondary | ICD-10-CM | POA: Diagnosis not present

## 2022-09-07 DIAGNOSIS — K900 Celiac disease: Secondary | ICD-10-CM | POA: Diagnosis not present

## 2022-09-07 DIAGNOSIS — E538 Deficiency of other specified B group vitamins: Secondary | ICD-10-CM | POA: Diagnosis not present

## 2022-09-07 DIAGNOSIS — D509 Iron deficiency anemia, unspecified: Secondary | ICD-10-CM | POA: Diagnosis not present

## 2022-09-07 MED ORDER — FOLIC ACID 1 MG PO TABS: 1.0000 mg | ORAL_TABLET | Freq: Every day | ORAL | 4 refills | Status: DC

## 2022-09-07 NOTE — Progress Notes (Signed)
CONSULT NOTE  Patient Care Team: Baruch Gouty, FNP as PCP - General (Family Medicine) Gala Romney, Cristopher Estimable, MD (Gastroenterology)  CHIEF COMPLAINTS/PURPOSE OF CONSULTATION:  Severe microcytic anemia  HISTORY OF PRESENTING ILLNESS:  Olivia Vance 41 y.o. female is seen in consultation today at the request of Darla Lesches, FNP for further work-up and management of anemia.  CBC on 08/10/2022 showed hemoglobin 9.3 MCV 75, WBC 2.9, ANC 1.3.  Ferritin was low at 3.  Percent saturation was 4.  TIBC was normal.  Folic acid was 2.7 and B12 was 383.  She has biopsy-proven celiac disease, diagnosed in 2006.  She has received B12 injections years ago.  She was prescribed iron tablet but she could not take it because of stomach problems.  She denies taking any folic acid.  Never had to have blood transfusion.  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  No parenteral iron therapy reported.  No drug allergies.  Denies any bleeding per rectum or melena.  Menses are regular, 3-4 days / 28 days, light to moderate.  She smokes half pack per day for 5 years.    MEDICAL HISTORY:  Past Medical History:  Diagnosis Date   Celiac disease 2007   Biopsy proven.   Previous pregnancy with HELLP syndrome, antepartum 03/2011    SURGICAL HISTORY: Past Surgical History:  Procedure Laterality Date   CESAREAN SECTION N/A 07/03/2014   Procedure: CESAREAN SECTION;  Surgeon: Daria Pastures, MD;  Location: [facility];  Service: Obstetrics;  Laterality: N/A;   ESOPHAGOGASTRODUODENOSCOPY  06/2006   small hh, pale duodenal bulb, 2nd and 3rd portions of duodenum abnormal appearing. Bx c/w celiac disease. Positive serologies.    SOCIAL HISTORY: Social History   Socioeconomic History   Marital status: Married    Spouse name: Not on file   Number of children: 1   Years of education: Not on file   Highest education level: Not on file  Occupational History   Occupation: Product manager: [employer]  Tobacco Use   Smoking status:  Former    Packs/day: 0.50    Types: Cigarettes   Smokeless tobacco: Never   Tobacco comments:    quit last sumer  Vaping Use   Vaping Use: Never used  Substance and Sexual Activity   Alcohol use: No   Drug use: No   Sexual activity: Yes    Birth control/protection: None  Other Topics Concern   Not on file  Social History Narrative   03/2011, daughter born   Social Determinants of Health   Financial Resource Strain: Not on file  Food Insecurity: Not on file  Transportation Needs: Not on file  Physical Activity: Not on file  Stress: Not on file  Social Connections: Not on file  Intimate Partner Violence: Not on file    FAMILY HISTORY: Family History  Problem Relation Age of Onset   Irritable bowel syndrome Mother    GER disease Father     ALLERGIES:  has No Known Allergies.  MEDICATIONS:  Current Outpatient Medications  Medication Sig Dispense Refill   escitalopram (LEXAPRO) 20 MG tablet Take 20 mg by mouth daily.     famotidine (PEPCID) 40 MG tablet Take 1 tablet (40 mg total) by mouth daily. 90 tablet 0   folic acid (FOLVITE) 1 MG tablet Take 1 tablet (1 mg total) by mouth daily. 90 tablet 4   Iron, Ferrous Sulfate, 325 (65 Fe) MG TABS Take 325 mg by mouth daily. 30 tablet  6   No current facility-administered medications for this visit.    REVIEW OF SYSTEMS:   Constitutional: Denies fevers, chills or abnormal night sweats Eyes: Denies blurriness of vision, double vision or watery eyes Ears, nose, mouth, throat, and face: Denies mucositis or sore throat Respiratory: Denies cough, dyspnea or wheezes Cardiovascular: Denies palpitation, chest discomfort or lower extremity swelling Gastrointestinal:  Denies nausea, heartburn or change in bowel habits Skin: Denies abnormal skin rashes Lymphatics: Denies new lymphadenopathy or easy bruising Neurological:Denies numbness, tingling or new weaknesses Behavioral/Psych: Mood is stable, no new changes  All other systems  were reviewed with the patient and are negative.  PHYSICAL EXAMINATION: ECOG PERFORMANCE STATUS: 0 - Asymptomatic  Vitals:   09/07/22 1310  BP: 107/74  Pulse: 99  Resp: 16  Temp: 98.1 F (36.7 C)  SpO2: 98%   Filed Weights   09/07/22 1310  Weight: 139 lb 8 oz (63.3 kg)    GENERAL:alert, no distress and comfortable SKIN: skin color, texture, turgor are normal, no rashes or significant lesions EYES: normal, conjunctiva are pink and non-injected, sclera clear OROPHARYNX:no exudate, no erythema and lips, buccal mucosa, and tongue normal  NECK: supple, thyroid normal size, non-tender, without nodularity LYMPH:  no palpable lymphadenopathy in the cervical, axillary or inguinal LUNGS: clear to auscultation and percussion with normal breathing effort HEART: regular rate & rhythm and no murmurs and no lower extremity edema ABDOMEN:abdomen soft, non-tender and normal bowel sounds Musculoskeletal:no cyanosis of digits and no clubbing  PSYCH: alert & oriented x 3 with fluent speech NEURO: no focal motor/sensory deficits  LABORATORY DATA:  I have reviewed the data as listed Recent Results (from the past 2160 hour(s))  Anemia Profile B     Status: Abnormal   Collection Time: 08/10/22 10:52 AM  Result Value Ref Range   Total Iron Binding Capacity 399 250 - 450 ug/dL   UIBC 383 131 - 425 ug/dL   Iron 16 (L) 27 - 159 ug/dL   Iron Saturation 4 (LL) 15 - 55 %   Ferritin 3 (L) 15 - 150 ng/mL   Vitamin B-12 383 232 - 1,245 pg/mL   Folate 2.7 (L) >3.0 ng/mL    Comment: A serum folate concentration of less than 3.1 ng/mL is considered to represent clinical deficiency.    WBC 2.9 (L) 3.4 - 10.8 x10E3/uL   RBC 4.14 3.77 - 5.28 x10E6/uL   Hemoglobin 9.3 (L) 11.1 - 15.9 g/dL   Hematocrit 31.1 (L) 34.0 - 46.6 %   MCV 75 (L) 79 - 97 fL   MCH 22.5 (L) 26.6 - 33.0 pg   MCHC 29.9 (L) 31.5 - 35.7 g/dL   RDW 16.7 (H) 11.7 - 15.4 %   Platelets 224 150 - 450 x10E3/uL   Neutrophils 45 Not Estab.  %   Lymphs 41 Not Estab. %   Monocytes 12 Not Estab. %   Eos 1 Not Estab. %   Basos 1 Not Estab. %   Neutrophils Absolute 1.3 (L) 1.4 - 7.0 x10E3/uL   Lymphocytes Absolute 1.2 0.7 - 3.1 x10E3/uL   Monocytes Absolute 0.3 0.1 - 0.9 x10E3/uL   EOS (ABSOLUTE) 0.0 0.0 - 0.4 x10E3/uL   Basophils Absolute 0.0 0.0 - 0.2 x10E3/uL   Immature Granulocytes 0 Not Estab. %   Immature Grans (Abs) 0.0 0.0 - 0.1 x10E3/uL   Retic Ct Pct 1.1 0.6 - 2.6 %    RADIOGRAPHIC STUDIES: I have personally reviewed the radiological images as listed and agreed with  the findings in the report. No results found.  ASSESSMENT:  1.  Severe microcytic anemia: - Patient seen at the request of Darla Lesches, FNP - CBC on 08/10/2022, Hb-9.3, MCV-75, ferritin-3, percent saturation-4, folic XVQM-0.8 Q76, 195, WBC-2.9, ANC-1.3 - Small bowel biopsy: Celiac disease in 2006 - Cannot tolerate iron pills due to stomach problems. - Positive for ice pica.  No transfusions or IV iron.  No BRBPR/melena.  Regular menses 3-4/28 days, light to moderate bleeding.  2.  Social/family history: - She lives at home with her husband and 2 kids.  She is a Patent examiner.  Smokes half pack per day for 5 years. - Niece has celiac disease.  Mother had low platelet.  Maternal grandfather had pancreatic cancer.  2 paternal aunts had breast cancer.  Paternal grandfather had esophageal cancer.  First cousin has non-Hodgkin's lymphoma.  PLAN:  1.  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 - She is unable to tolerate oral iron therapy due to stomach problems. - We talked about parenteral iron therapy with Venofer.  We discussed rare chance of anaphylactic reactions. - She will proceed with Venofer for a total dose of 1 g in 3 divided doses. - RTC 3 months with repeat CBC, ferritin, iron panel and folic acid.  2.  Leukopenia: - She has leukopenia on 06/03/2021 on  08/10/2022. - ANC was low at 1.2 and 1.3 respectively. - Likely from low folic acid levels.  We will start her on folic acid 1 mg tablet daily.  We will repeat CBC in 3 months.    All questions were answered. The patient knows to call the clinic with any problems, questions or concerns.      Derek Jack, MD 09/07/22 2:51 PM

## 2022-09-07 NOTE — Patient Instructions (Addendum)
[place] at [facility] Discharge Instructions   You were seen and examined today by Dr. Delton Coombes. He is a blood specialist who is seeing you today for anemia and iron deficiency.   We sent a prescription to your pharmacy for folic acid. You will need to take one tablet daily.  We will arrange for you to have 3 infusions of iron.   Return as scheduled.    Thank you for choosing [place] at [facility] to provide your oncology and hematology care.  To afford each patient quality time with our provider, please arrive at least 15 minutes before your scheduled appointment time.   If you have a lab appointment with the [facility] please come in thru the Main Entrance and check in at the main information desk.  You need to re-schedule your appointment should you arrive 10 or more minutes late.  We strive to give you quality time with our providers, and arriving late affects you and other patients whose appointments are after yours.  Also, if you no show three or more times for appointments you may be dismissed from the clinic at the providers discretion.     Again, thank you for choosing [facility].  Our hope is that these requests will decrease the amount of time that you wait before being seen by our physicians.       _____________________________________________________________  Should you have questions after your visit to [facility], please contact our office at [phone] and follow the prompts.  Our office hours are 8:00 a.m. and 4:30 p.m. Monday - Friday.  Please note that voicemails left after 4:00 p.m. may not be returned until the following business day.  We are closed weekends and major holidays.  You do have access to a nurse 24-7, just call the main number to the clinic [phone] and do not press any options, hold on the line and a nurse will answer the phone.    For prescription refill requests,  have your pharmacy contact our office and allow 72 hours.    Due to Covid, you will need to wear a mask upon entering the hospital. If you do not have a mask, a mask will be given to you at the Main Entrance upon arrival. For doctor visits, patients may have 1 support person age 62 or older with them. For treatment visits, patients can not have anyone with them due to social distancing guidelines and our immunocompromised population.

## 2022-09-08 ENCOUNTER — Encounter: Payer: Self-pay | Admitting: Hematology

## 2022-09-12 ENCOUNTER — Inpatient Hospital Stay: Payer: BC Managed Care – PPO

## 2022-09-12 VITALS — BP 99/77 | HR 77 | Temp 99.0°F | Resp 18

## 2022-09-12 DIAGNOSIS — D508 Other iron deficiency anemias: Secondary | ICD-10-CM

## 2022-09-12 DIAGNOSIS — K900 Celiac disease: Secondary | ICD-10-CM

## 2022-09-12 DIAGNOSIS — D509 Iron deficiency anemia, unspecified: Secondary | ICD-10-CM | POA: Diagnosis not present

## 2022-09-12 MED ORDER — SODIUM CHLORIDE 0.9 % IV SOLN
300.0000 mg | Freq: Once | INTRAVENOUS | Status: AC
  Administered 2022-09-12: 300 mg via INTRAVENOUS
  Filled 2022-09-12: qty 300

## 2022-09-12 MED ORDER — LORATADINE 10 MG PO TABS
10.0000 mg | ORAL_TABLET | Freq: Once | ORAL | Status: AC
  Administered 2022-09-12: 10 mg via ORAL
  Filled 2022-09-12: qty 1

## 2022-09-12 MED ORDER — SODIUM CHLORIDE 0.9 % IV SOLN: Freq: Once | INTRAVENOUS | Status: AC

## 2022-09-12 MED ORDER — ACETAMINOPHEN 325 MG PO TABS
650.0000 mg | ORAL_TABLET | Freq: Once | ORAL | Status: AC
  Administered 2022-09-12: 650 mg via ORAL
  Filled 2022-09-12: qty 2

## 2022-09-12 NOTE — Progress Notes (Signed)
Venofer infusion given per orders. Patient tolerated it well without problems. Vitals stable and discharged home from clinic ambulatory. Follow up as scheduled.

## 2022-09-12 NOTE — Patient Instructions (Signed)
[facility]  Discharge Instructions: Thank you for choosing [facility] to provide your oncology and hematology care.  If you have a lab appointment with the [facility], please come in thru the Main Entrance and check in at the main information desk.  Wear comfortable clothing and clothing appropriate for easy access to any Portacath or PICC line.   We strive to give you quality time with your provider. You may need to reschedule your appointment if you arrive late (15 or more minutes).  Arriving late affects you and other patients whose appointments are after yours.  Also, if you miss three or more appointments without notifying the office, you may be dismissed from the clinic at the provider's discretion.      For prescription refill requests, have your pharmacy contact our office and allow 72 hours for refills to be completed.    Today you received the following iron infusion, venofer '300mg'$    To help prevent nausea and vomiting after your treatment, we encourage you to take your nausea medication as directed.  BELOW ARE SYMPTOMS THAT SHOULD BE REPORTED IMMEDIATELY: *FEVER GREATER THAN 100.4 F (38 C) OR HIGHER *CHILLS OR SWEATING *NAUSEA AND VOMITING THAT IS NOT CONTROLLED WITH YOUR NAUSEA MEDICATION *UNUSUAL SHORTNESS OF BREATH *UNUSUAL BRUISING OR BLEEDING *URINARY PROBLEMS (pain or burning when urinating, or frequent urination) *BOWEL PROBLEMS (unusual diarrhea, constipation, pain near the anus) TENDERNESS IN MOUTH AND THROAT WITH OR WITHOUT PRESENCE OF ULCERS (sore throat, sores in mouth, or a toothache) UNUSUAL RASH, SWELLING OR PAIN  UNUSUAL VAGINAL DISCHARGE OR ITCHING   Items with * indicate a potential emergency and should be followed up as soon as possible or go to the Emergency Department if any problems should occur.  Please show the CHEMOTHERAPY ALERT CARD or IMMUNOTHERAPY ALERT CARD at check-in to the Emergency Department and triage  nurse.  Should you have questions after your visit or need to cancel or reschedule your appointment, please contact [facility] [phone]  and follow the prompts.  Office hours are 8:00 a.m. to 4:30 p.m. Monday - Friday. Please note that voicemails left after 4:00 p.m. may not be returned until the following business day.  We are closed weekends and major holidays. You have access to a nurse at all times for urgent questions. Please call the main number to the clinic [phone] and follow the prompts.  For any non-urgent questions, you may also contact your provider using MyChart. We now offer e-Visits for anyone 31 and older to request care online for non-urgent symptoms. For details visit [email or web address].   Also download the MyChart app! Go to the app store, search "MyChart", open the app, select [facility], and log in with your MyChart username and password.  Masks are optional in the cancer centers. If you would like for your care team to wear a mask while they are taking care of you, please let them know. You may have one support person who is at least 40 years old accompany you for your appointments.

## 2022-09-20 ENCOUNTER — Inpatient Hospital Stay: Payer: BC Managed Care – PPO

## 2022-09-20 VITALS — BP 97/70 | HR 71 | Temp 98.4°F | Resp 18

## 2022-09-20 DIAGNOSIS — K900 Celiac disease: Secondary | ICD-10-CM

## 2022-09-20 DIAGNOSIS — D509 Iron deficiency anemia, unspecified: Secondary | ICD-10-CM | POA: Diagnosis not present

## 2022-09-20 DIAGNOSIS — D508 Other iron deficiency anemias: Secondary | ICD-10-CM

## 2022-09-20 MED ORDER — ACETAMINOPHEN 325 MG PO TABS
650.0000 mg | ORAL_TABLET | Freq: Once | ORAL | Status: AC
  Administered 2022-09-20: 650 mg via ORAL
  Filled 2022-09-20: qty 2

## 2022-09-20 MED ORDER — SODIUM CHLORIDE 0.9 % IV SOLN: Freq: Once | INTRAVENOUS | Status: AC

## 2022-09-20 MED ORDER — SODIUM CHLORIDE 0.9 % IV SOLN
300.0000 mg | Freq: Once | INTRAVENOUS | Status: AC
  Administered 2022-09-20: 300 mg via INTRAVENOUS
  Filled 2022-09-20: qty 300

## 2022-09-20 MED ORDER — LORATADINE 10 MG PO TABS
10.0000 mg | ORAL_TABLET | Freq: Once | ORAL | Status: AC
  Administered 2022-09-20: 10 mg via ORAL
  Filled 2022-09-20: qty 1

## 2022-09-20 NOTE — Patient Instructions (Signed)
[facility]  Discharge Instructions: Thank you for choosing [facility] to provide your oncology and hematology care.  If you have a lab appointment with the Cancer Center, please come in thru the Main Entrance and check in at the main information desk.  Wear comfortable clothing and clothing appropriate for easy access to any Portacath or PICC line.   We strive to give you quality time with your provider. You may need to reschedule your appointment if you arrive late (15 or more minutes).  Arriving late affects you and other patients whose appointments are after yours.  Also, if you miss three or more appointments without notifying the office, you may be dismissed from the clinic at the provider's discretion.      For prescription refill requests, have your pharmacy contact our office and allow 72 hours for refills to be completed.    Today you received Venofer IV iron infusion.     BELOW ARE SYMPTOMS THAT SHOULD BE REPORTED IMMEDIATELY: *FEVER GREATER THAN 100.4 F (38 C) OR HIGHER *CHILLS OR SWEATING *NAUSEA AND VOMITING THAT IS NOT CONTROLLED WITH YOUR NAUSEA MEDICATION *UNUSUAL SHORTNESS OF BREATH *UNUSUAL BRUISING OR BLEEDING *URINARY PROBLEMS (pain or burning when urinating, or frequent urination) *BOWEL PROBLEMS (unusual diarrhea, constipation, pain near the anus) TENDERNESS IN MOUTH AND THROAT WITH OR WITHOUT PRESENCE OF ULCERS (sore throat, sores in mouth, or a toothache) UNUSUAL RASH, SWELLING OR PAIN  UNUSUAL VAGINAL DISCHARGE OR ITCHING   Items with * indicate a potential emergency and should be followed up as soon as possible or go to the Emergency Department if any problems should occur.  Please show the CHEMOTHERAPY ALERT CARD or IMMUNOTHERAPY ALERT CARD at check-in to the Emergency Department and triage nurse.  Should you have questions after your visit or need to cancel or reschedule your appointment, please contact [facility] [place] [phone]  and follow the prompts.  Office hours are 8:00 a.m. to 4:30 p.m. Monday - Friday. Please note that voicemails left after 4:00 p.m. may not be returned until the following business day.  We are closed weekends and major holidays. You have access to a nurse at all times for urgent questions. Please call the main number to the clinic [phone] and follow the prompts.  For any non-urgent questions, you may also contact your provider using MyChart. We now offer e-Visits for anyone 18 and older to request care online for non-urgent symptoms. For details visit [email or web address].   Also download the MyChart app! Go to the app store, search "MyChart", open the app, select [facility], and log in with your MyChart username and password.  Masks are optional in the cancer centers. If you would like for your care team to wear a mask while they are taking care of you, please let them know. You may have one support person who is at least 40 years old accompany you for your appointments.  

## 2022-09-20 NOTE — Progress Notes (Signed)
Pt presents today for Venofer IV iron infusion per provider's order. Vital signs stable and pt voiced no new complaints at this time.  Peripheral IV started with good blood return pre and post infusion.  Venofer 300 mg  given today per MD orders. Tolerated infusion without adverse affects. Vital signs stable. No complaints at this time. Discharged from clinic ambulatory in stable condition. Alert and oriented x 3. F/U with [facility] as scheduled.   

## 2022-09-27 ENCOUNTER — Inpatient Hospital Stay: Payer: BC Managed Care – PPO

## 2022-09-27 VITALS — BP 99/68 | HR 73 | Temp 97.9°F | Resp 18

## 2022-09-27 DIAGNOSIS — D508 Other iron deficiency anemias: Secondary | ICD-10-CM

## 2022-09-27 DIAGNOSIS — K900 Celiac disease: Secondary | ICD-10-CM

## 2022-09-27 DIAGNOSIS — D509 Iron deficiency anemia, unspecified: Secondary | ICD-10-CM | POA: Diagnosis not present

## 2022-09-27 MED ORDER — SODIUM CHLORIDE 0.9 % IV SOLN: Freq: Once | INTRAVENOUS | Status: AC

## 2022-09-27 MED ORDER — SODIUM CHLORIDE 0.9 % IV SOLN
300.0000 mg | Freq: Once | INTRAVENOUS | Status: DC
  Filled 2022-09-27: qty 15

## 2022-09-27 MED ORDER — SODIUM CHLORIDE 0.9 % IV SOLN
400.0000 mg | Freq: Once | INTRAVENOUS | Status: AC
  Administered 2022-09-27: 400 mg via INTRAVENOUS
  Filled 2022-09-27: qty 20

## 2022-09-27 MED ORDER — LORATADINE 10 MG PO TABS
10.0000 mg | ORAL_TABLET | Freq: Once | ORAL | Status: AC
  Administered 2022-09-27: 10 mg via ORAL
  Filled 2022-09-27: qty 1

## 2022-09-27 MED ORDER — ACETAMINOPHEN 325 MG PO TABS
650.0000 mg | ORAL_TABLET | Freq: Once | ORAL | Status: AC
  Administered 2022-09-27: 650 mg via ORAL
  Filled 2022-09-27: qty 2

## 2022-09-27 NOTE — Patient Instructions (Signed)
[facility] [place]  Discharge Instructions: Thank you for choosing [facility] to provide your oncology and hematology care.  If you have a lab appointment with the Cancer Center, please come in thru the Main Entrance and check in at the main information desk.  Wear comfortable clothing and clothing appropriate for easy access to any Portacath or PICC line.   We strive to give you quality time with your provider. You may need to reschedule your appointment if you arrive late (15 or more minutes).  Arriving late affects you and other patients whose appointments are after yours.  Also, if you miss three or more appointments without notifying the office, you may be dismissed from the clinic at the provider's discretion.      For prescription refill requests, have your pharmacy contact our office and allow 72 hours for refills to be completed.    Today you received Venofer IV iron infusion.     BELOW ARE SYMPTOMS THAT SHOULD BE REPORTED IMMEDIATELY: *FEVER GREATER THAN 100.4 F (38 C) OR HIGHER *CHILLS OR SWEATING *NAUSEA AND VOMITING THAT IS NOT CONTROLLED WITH YOUR NAUSEA MEDICATION *UNUSUAL SHORTNESS OF BREATH *UNUSUAL BRUISING OR BLEEDING *URINARY PROBLEMS (pain or burning when urinating, or frequent urination) *BOWEL PROBLEMS (unusual diarrhea, constipation, pain near the anus) TENDERNESS IN MOUTH AND THROAT WITH OR WITHOUT PRESENCE OF ULCERS (sore throat, sores in mouth, or a toothache) UNUSUAL RASH, SWELLING OR PAIN  UNUSUAL VAGINAL DISCHARGE OR ITCHING   Items with * indicate a potential emergency and should be followed up as soon as possible or go to the Emergency Department if any problems should occur.  Please show the CHEMOTHERAPY ALERT CARD or IMMUNOTHERAPY ALERT CARD at check-in to the Emergency Department and triage nurse.  Should you have questions after your visit or need to cancel or reschedule your appointment, please contact [facility] [place] [phone]  and follow the prompts.  Office hours are 8:00 a.m. to 4:30 p.m. Monday - Friday. Please note that voicemails left after 4:00 p.m. may not be returned until the following business day.  We are closed weekends and major holidays. You have access to a nurse at all times for urgent questions. Please call the main number to the clinic [phone] and follow the prompts.  For any non-urgent questions, you may also contact your provider using MyChart. We now offer e-Visits for anyone 18 and older to request care online for non-urgent symptoms. For details visit [email or web address].   Also download the MyChart app! Go to the app store, search "MyChart", open the app, select [facility], and log in with your MyChart username and password.  Masks are optional in the cancer centers. If you would like for your care team to wear a mask while they are taking care of you, please let them know. You may have one support person who is at least 40 years old accompany you for your appointments.  

## 2022-09-27 NOTE — Progress Notes (Signed)
Pt presents today for Venofer IV iron infusion per provider's order. Vital signs stable and pt voiced no new complaints at this time.  Peripheral IV started with good blood return pre and post infusion.  Venofer 400 mg given today per MD orders. Tolerated infusion without adverse affects. Vital signs stable. No complaints at this time. Discharged from clinic ambulatory in stable condition. Alert and oriented x 3. F/U with [facility] as scheduled.   

## 2022-10-10 LAB — HM PAP SMEAR: HM Pap smear: NEGATIVE

## 2022-10-20 ENCOUNTER — Other Ambulatory Visit: Payer: Self-pay | Admitting: Family Medicine

## 2022-10-20 DIAGNOSIS — K219 Gastro-esophageal reflux disease without esophagitis: Secondary | ICD-10-CM

## 2022-11-01 ENCOUNTER — Other Ambulatory Visit: Payer: Self-pay | Admitting: Family Medicine

## 2022-11-01 ENCOUNTER — Encounter: Payer: Self-pay | Admitting: Family Medicine

## 2022-11-01 DIAGNOSIS — K219 Gastro-esophageal reflux disease without esophagitis: Secondary | ICD-10-CM

## 2022-11-01 MED ORDER — PANTOPRAZOLE SODIUM 40 MG PO TBEC: 40.0000 mg | DELAYED_RELEASE_TABLET | Freq: Every day | ORAL | 3 refills | Status: DC

## 2022-11-23 ENCOUNTER — Other Ambulatory Visit: Payer: Self-pay | Admitting: Family Medicine

## 2022-11-30 ENCOUNTER — Encounter: Payer: Self-pay | Admitting: Family Medicine

## 2022-11-30 ENCOUNTER — Telehealth: Payer: BC Managed Care – PPO | Admitting: Family Medicine

## 2022-11-30 DIAGNOSIS — J0140 Acute pansinusitis, unspecified: Secondary | ICD-10-CM | POA: Diagnosis not present

## 2022-11-30 DIAGNOSIS — Z8619 Personal history of other infectious and parasitic diseases: Secondary | ICD-10-CM | POA: Diagnosis not present

## 2022-11-30 MED ORDER — AMOXICILLIN-POT CLAVULANATE 875-125 MG PO TABS: 1.0000 | ORAL_TABLET | Freq: Two times a day (BID) | ORAL | 0 refills | Status: AC

## 2022-11-30 MED ORDER — FLUCONAZOLE 150 MG PO TABS: 150.0000 mg | ORAL_TABLET | Freq: Once | ORAL | 0 refills | Status: AC

## 2022-11-30 NOTE — Progress Notes (Signed)
Virtual Visit via MyChart Video Note Due to COVID-19 pandemic this visit was conducted virtually. This visit type was conducted due to national recommendations for restrictions regarding the COVID-19 Pandemic (e.g. social distancing, sheltering in place) in an effort to limit this patient's exposure and mitigate transmission in our community. All issues noted in this document were discussed and addressed.  A physical exam was not performed with this format.   I connected with Olivia Vance on 11/30/2022 at 0800 by MyChart Video and verified that I am speaking with the correct person using two identifiers. Olivia Vance is currently located at work and patient is currently with them during visit. The provider, Monia Pouch, FNP is located in their office at time of visit.  I discussed the limitations, risks, security and privacy concerns of performing an evaluation and management service by virtual visit and the availability of in person appointments. I also discussed with the patient that there may be a patient responsible charge related to this service. The patient expressed understanding and agreed to proceed.  Subjective:  Patient ID: Olivia Vance, female    DOB: 1981/08/27, 42 y.o.   MRN: [id number]  Chief Complaint:  Sinusitis   HPI: Olivia Vance is a 42 y.o. female presenting on 11/30/2022 for Sinusitis   Pt reports ongoing frontal and maxillary sinus pressure and pain with postnasal drainage and congestion. States her teeth are now starting to ache. Has been using Flonase and Mucinex without relief of symptoms.   Sinusitis This is a new problem. The current episode started 1 to 4 weeks ago. The problem has been gradually worsening since onset. The pain is moderate. Associated symptoms include congestion, ear pain, headaches, sinus pressure and a sore throat. Pertinent negatives include no chills, coughing, diaphoresis, hoarse voice, neck pain, shortness of breath, sneezing or swollen  glands. Past treatments include nasal decongestants and oral decongestants. The treatment provided no relief.     Relevant past medical, surgical, family, and social history reviewed and updated as indicated.  Allergies and medications reviewed and updated.   Past Medical History:  Diagnosis Date   Celiac disease 2007   Biopsy proven.   Previous pregnancy with HELLP syndrome, antepartum 03/2011    Past Surgical History:  Procedure Laterality Date   CESAREAN SECTION N/A 07/03/2014   Procedure: CESAREAN SECTION;  Surgeon: Daria Pastures, MD;  Location: [facility];  Service: Obstetrics;  Laterality: N/A;   ESOPHAGOGASTRODUODENOSCOPY  06/2006   small hh, pale duodenal bulb, 2nd and 3rd portions of duodenum abnormal appearing. Bx c/w celiac disease. Positive serologies.    Social History   Socioeconomic History   Marital status: Married    Spouse name: Not on file   Number of children: 1   Years of education: Not on file   Highest education level: Not on file  Occupational History   Occupation: Pharmacist, hospital    Employer: [employer]  Tobacco Use   Smoking status: Former    Packs/day: 0.50    Types: Cigarettes   Smokeless tobacco: Never   Tobacco comments:    quit last sumer  Vaping Use   Vaping Use: Never used  Substance and Sexual Activity   Alcohol use: No   Drug use: No   Sexual activity: Yes    Birth control/protection: None  Other Topics Concern   Not on file  Social History Narrative   03/2011, daughter born   Social Determinants of Health   Financial Resource Strain:  Not on file  Food Insecurity: Not on file  Transportation Needs: Not on file  Physical Activity: Not on file  Stress: Not on file  Social Connections: Not on file  Intimate Partner Violence: Not on file    Outpatient Encounter Medications as of 11/30/2022  Medication Sig   amoxicillin-clavulanate (AUGMENTIN) 875-125 MG tablet Take 1 tablet by mouth 2 (two) times daily for 10 days.    fluconazole (DIFLUCAN) 150 MG tablet Take 1 tablet (150 mg total) by mouth once for 1 dose.   pantoprazole (PROTONIX) 40 MG tablet Take 1 tablet (40 mg total) by mouth daily.   escitalopram (LEXAPRO) 20 MG tablet Take 20 mg by mouth daily.   folic acid (FOLVITE) 1 MG tablet Take 1 tablet (1 mg total) by mouth daily.   Iron, Ferrous Sulfate, 325 (65 Fe) MG TABS Take 325 mg by mouth daily.   No facility-administered encounter medications on file as of 11/30/2022.    No Known Allergies  Review of Systems  Constitutional:  Negative for activity change, appetite change, chills, diaphoresis, fatigue, fever and unexpected weight change.  HENT:  Positive for congestion, ear pain, postnasal drip, rhinorrhea, sinus pressure, sinus pain and sore throat. Negative for dental problem, drooling, ear discharge, facial swelling, hearing loss, hoarse voice, mouth sores, nosebleeds, sneezing, tinnitus, trouble swallowing and voice change.   Eyes: Negative.  Negative for photophobia and visual disturbance.  Respiratory:  Negative for cough, choking, chest tightness, shortness of breath, wheezing and stridor.   Cardiovascular:  Negative for chest pain, palpitations and leg swelling.  Gastrointestinal:  Negative for abdominal pain, blood in stool, constipation, diarrhea, nausea and vomiting.  Endocrine: Negative.   Genitourinary:  Negative for decreased urine volume, difficulty urinating, dysuria, frequency and urgency.  Musculoskeletal:  Negative for arthralgias, myalgias and neck pain.  Skin: Negative.   Allergic/Immunologic: Negative.   Neurological:  Positive for headaches. Negative for dizziness, tremors, seizures, syncope, facial asymmetry, speech difficulty, weakness, light-headedness and numbness.  Hematological: Negative.   Psychiatric/Behavioral:  Negative for confusion, hallucinations, sleep disturbance and suicidal ideas.   All other systems reviewed and are negative.       Observations/Objective: No vital signs or physical exam, this was a virtual health encounter.  Pt alert and oriented, answers all questions appropriately, and able to speak in full sentences. Points to forehead and cheeks when describing pressure/pain. Nose congestion noted.    Assessment and Plan: Olivia Vance was seen today for sinusitis.  Diagnoses and all orders for this visit:  Acute non-recurrent pansinusitis Has tried and failed symptomatic care at home. Pt aware to continue Flonase and Mucinex. Will add Augmentin. Report new, worsening, or persistent symptoms.  -     amoxicillin-clavulanate (AUGMENTIN) 875-125 MG tablet; Take 1 tablet by mouth 2 (two) times daily for 10 days.  History of candidal vulvovaginitis History of yeast with antibiotic therapy. Will provide with below. Pt aware of why and when to take. If not needed, pt not to take.  -     fluconazole (DIFLUCAN) 150 MG tablet; Take 1 tablet (150 mg total) by mouth once for 1 dose.     Follow Up Instructions: Return if symptoms worsen or fail to improve.    I discussed the assessment and treatment plan with the patient. The patient was provided an opportunity to ask questions and all were answered. The patient agreed with the plan and demonstrated an understanding of the instructions.   The patient was advised to call back or seek  an in-person evaluation if the symptoms worsen or if the condition fails to improve as anticipated.  The above assessment and management plan was discussed with the patient. The patient verbalized understanding of and has agreed to the management plan. Patient is aware to call the clinic if they develop any new symptoms or if symptoms persist or worsen. Patient is aware when to return to the clinic for a follow-up visit. Patient educated on when it is appropriate to go to the emergency department.    I provided 11 minutes of time during this MyChart Video encounter.   Monia Pouch,  FNP-C [facility] [address] [place], [place] [address] [phone] 11/30/2022

## 2022-12-15 ENCOUNTER — Inpatient Hospital Stay: Payer: BC Managed Care – PPO

## 2022-12-22 ENCOUNTER — Ambulatory Visit: Payer: Self-pay | Admitting: Physician Assistant

## 2023-01-22 ENCOUNTER — Inpatient Hospital Stay: Payer: BC Managed Care – PPO | Attending: Hematology

## 2023-01-30 ENCOUNTER — Ambulatory Visit: Payer: Self-pay | Admitting: Hematology

## 2023-05-25 ENCOUNTER — Encounter: Payer: Self-pay | Admitting: Family Medicine

## 2023-05-25 ENCOUNTER — Ambulatory Visit: Payer: BC Managed Care – PPO | Admitting: Family Medicine

## 2023-05-25 VITALS — BP 113/79 | HR 71 | Temp 97.7°F | Ht 65.0 in | Wt 151.8 lb

## 2023-05-25 DIAGNOSIS — R300 Dysuria: Secondary | ICD-10-CM

## 2023-05-25 DIAGNOSIS — N3001 Acute cystitis with hematuria: Secondary | ICD-10-CM | POA: Diagnosis not present

## 2023-05-25 DIAGNOSIS — B379 Candidiasis, unspecified: Secondary | ICD-10-CM

## 2023-05-25 LAB — URINALYSIS, ROUTINE W REFLEX MICROSCOPIC
Bilirubin, UA: NEGATIVE
Glucose, UA: NEGATIVE
Ketones, UA: NEGATIVE
Nitrite, UA: NEGATIVE
Protein,UA: NEGATIVE
Specific Gravity, UA: <1.005 — ABNORMAL LOW (ref 1.005–1.030)
Urobilinogen, Ur: 0.2 mg/dL (ref 0.2–1.0)
pH, UA: 6.5 (ref 5.0–7.5)

## 2023-05-25 LAB — MICROSCOPIC EXAMINATION: Renal Epithel, UA: NONE SEEN /hpf

## 2023-05-25 MED ORDER — NITROFURANTOIN MONOHYD MACRO 100 MG PO CAPS: 100.0000 mg | ORAL_CAPSULE | Freq: Two times a day (BID) | ORAL | 0 refills | Status: AC

## 2023-05-25 MED ORDER — FLUCONAZOLE 150 MG PO TABS: ORAL_TABLET | ORAL | 0 refills | Status: DC

## 2023-05-25 NOTE — Progress Notes (Signed)
Subjective:  Patient ID: Olivia Vance, female    DOB: 12/31/1980, 42 y.o.   MRN: [id number]  Patient Care Team: Sonny Masters, FNP as PCP - General (Family Medicine) Jena Gauss Gerrit Friends, MD (Gastroenterology)   Chief Complaint:  Urinary Frequency (X 1 week )   HPI: Olivia Vance is a 42 y.o. female presenting on 05/25/2023 for Urinary Frequency (X 1 week )   Urinary Frequency  This is a new problem. The current episode started in the past 7 days. The problem occurs every urination. The problem has been gradually worsening. The quality of the pain is described as aching and burning. The pain is moderate. There has been no fever. She is Sexually active. There is No history of pyelonephritis. Associated symptoms include frequency and urgency. Pertinent negatives include no chills, discharge, flank pain, hematuria, hesitancy, nausea, possible pregnancy, sweats or vomiting. She has tried increased fluids for the symptoms. The treatment provided no relief. There is no history of recurrent UTIs.    Relevant past medical, surgical, family, and social history reviewed and updated as indicated.  Allergies and medications reviewed and updated. Data reviewed: Chart in Epic.   Past Medical History:  Diagnosis Date   Celiac disease 2007   Biopsy proven.   Previous pregnancy with HELLP syndrome, antepartum 03/2011    Past Surgical History:  Procedure Laterality Date   CESAREAN SECTION N/A 07/03/2014   Procedure: CESAREAN SECTION;  Surgeon: Loney Laurence, MD;  Location: [facility];  Service: Obstetrics;  Laterality: N/A;   ESOPHAGOGASTRODUODENOSCOPY  06/2006   small hh, pale duodenal bulb, 2nd and 3rd portions of duodenum abnormal appearing. Bx c/w celiac disease. Positive serologies.    Social History   Socioeconomic History   Marital status: Married    Spouse name: Not on file   Number of children: 1   Years of education: Not on file   Highest education level: Not on file   Occupational History   Occupation: Runner, broadcasting/film/video    Employer: [employer]  Tobacco Use   Smoking status: Former    Current packs/day: 0.50    Types: Cigarettes   Smokeless tobacco: Never   Tobacco comments:    quit last sumer  Vaping Use   Vaping status: Never Used  Substance and Sexual Activity   Alcohol use: No   Drug use: No   Sexual activity: Yes    Birth control/protection: None  Other Topics Concern   Not on file  Social History Narrative   03/2011, daughter born   Social Determinants of Health   Financial Resource Strain: Not on file  Food Insecurity: Not on file  Transportation Needs: Not on file  Physical Activity: Not on file  Stress: Not on file  Social Connections: Not on file  Intimate Partner Violence: Not on file    Outpatient Encounter Medications as of 05/25/2023  Medication Sig   escitalopram (LEXAPRO) 20 MG tablet Take 20 mg by mouth daily.   fluconazole (DIFLUCAN) 150 MG tablet 1 po q week x 4 weeks   nitrofurantoin, macrocrystal-monohydrate, (MACROBID) 100 MG capsule Take 1 capsule (100 mg total) by mouth 2 (two) times daily for 7 days. 1 po BId   pantoprazole (PROTONIX) 40 MG tablet Take 1 tablet (40 mg total) by mouth daily.   folic acid (FOLVITE) 1 MG tablet Take 1 tablet (1 mg total) by mouth daily. (Patient not taking: Reported on 05/25/2023)   Iron, Ferrous Sulfate, 325 (65 Fe)  MG TABS Take 325 mg by mouth daily. (Patient not taking: Reported on 05/25/2023)   No facility-administered encounter medications on file as of 05/25/2023.    No Known Allergies  Review of Systems  Constitutional:  Negative for activity change, appetite change, chills, diaphoresis, fatigue, fever and unexpected weight change.  HENT: Negative.    Eyes: Negative.  Negative for photophobia and visual disturbance.  Respiratory:  Negative for cough, chest tightness and shortness of breath.   Cardiovascular:  Negative for chest pain, palpitations and leg swelling.   Gastrointestinal:  Negative for abdominal pain, blood in stool, constipation, diarrhea, nausea and vomiting.  Endocrine: Negative.   Genitourinary:  Positive for dysuria, frequency and urgency. Negative for decreased urine volume, difficulty urinating, dyspareunia, enuresis, flank pain, genital sores, hematuria, hesitancy, menstrual problem, pelvic pain, vaginal bleeding, vaginal discharge and vaginal pain.  Musculoskeletal:  Negative for arthralgias and myalgias.  Skin: Negative.   Allergic/Immunologic: Negative.   Neurological:  Negative for dizziness, tremors, seizures, syncope, facial asymmetry, speech difficulty, weakness, light-headedness, numbness and headaches.  Hematological: Negative.   Psychiatric/Behavioral:  Negative for confusion, hallucinations, sleep disturbance and suicidal ideas.   All other systems reviewed and are negative.       Objective:  BP 113/79   Pulse 71   Temp 97.7 F (36.5 C) (Temporal)   Ht 5\' 5"  (1.651 m)   Wt 151 lb 12.8 oz (68.9 kg)   SpO2 98%   BMI 25.26 kg/m    Wt Readings from Last 3 Encounters:  05/25/23 151 lb 12.8 oz (68.9 kg)  09/07/22 139 lb 8 oz (63.3 kg)  08/10/22 142 lb 6.4 oz (64.6 kg)    Physical Exam Vitals and nursing note reviewed.  Constitutional:      General: She is not in acute distress.    Appearance: Normal appearance. She is normal weight. She is not ill-appearing, toxic-appearing or diaphoretic.  HENT:     Head: Normocephalic and atraumatic.     Mouth/Throat:     Mouth: Mucous membranes are moist.  Eyes:     Conjunctiva/sclera: Conjunctivae normal.     Pupils: Pupils are equal, round, and reactive to light.  Cardiovascular:     Rate and Rhythm: Normal rate and regular rhythm.     Heart sounds: Normal heart sounds.  Pulmonary:     Effort: Pulmonary effort is normal.     Breath sounds: Normal breath sounds.  Abdominal:     General: Bowel sounds are normal.     Palpations: Abdomen is soft.     Tenderness:  There is no right CVA tenderness or left CVA tenderness.  Musculoskeletal:     Cervical back: Neck supple.     Right lower leg: No edema.     Left lower leg: No edema.  Skin:    General: Skin is warm and dry.     Capillary Refill: Capillary refill takes less than 2 seconds.  Neurological:     General: No focal deficit present.     Mental Status: She is alert and oriented to person, place, and time.  Psychiatric:        Mood and Affect: Mood normal.        Behavior: Behavior normal.        Thought Content: Thought content normal.        Judgment: Judgment normal.     Results for orders placed or performed in visit on 10/10/22  HM PAP SMEAR  Result Value Ref Range  HM Pap smear negative        Pertinent labs & imaging results that were available during my care of the patient were reviewed by me and considered in my medical decision making.  Assessment & Plan:  Amal was seen today for urinary frequency.  Diagnoses and all orders for this visit:  Dysuria Acute cystitis with hematuria Yeast detected -     Urinalysis, Routine w reflex microscopic -     Urine Culture Urinalysis indicative of cystitis, yeast present also. Will treat with below. Culture pending, will change therapy if warranted. Increase water intake and avoid bladder irritants.  -     nitrofurantoin, macrocrystal-monohydrate, (MACROBID) 100 MG capsule; Take 1 capsule (100 mg total) by mouth 2 (two) times daily for 7 days. 1 po BId -     fluconazole (DIFLUCAN) 150 MG tablet; 1 po q week x 4 weeks     Continue all other maintenance medications.  Follow up plan: Return if symptoms worsen or fail to improve.   Continue healthy lifestyle choices, including diet (rich in fruits, vegetables, and lean proteins, and low in salt and simple carbohydrates) and exercise (at least 30 minutes of moderate physical activity daily).  The above assessment and management plan was discussed with the patient. The patient  verbalized understanding of and has agreed to the management plan. Patient is aware to call the clinic if they develop any new symptoms or if symptoms persist or worsen. Patient is aware when to return to the clinic for a follow-up visit. Patient educated on when it is appropriate to go to the emergency department.   Kari Baars, FNP-C [facility] [phone]

## 2023-06-11 ENCOUNTER — Encounter: Payer: Self-pay | Admitting: Family Medicine

## 2023-06-11 ENCOUNTER — Other Ambulatory Visit: Payer: Self-pay | Admitting: Family Medicine

## 2023-06-11 DIAGNOSIS — K900 Celiac disease: Secondary | ICD-10-CM

## 2023-06-12 NOTE — Progress Notes (Unsigned)
Referring Provider: Sonny Masters, FNP Primary Care Physician:  Sonny Masters, FNP Primary Gastroenterologist:  Dr. Jena Gauss  No chief complaint on file.   HPI:   Olivia Vance is a 42 y.o. female presenting today at the request of  Rakes, Doralee Albino, FNP for celiac disease.   Patient has known history of celiac disease with positive TTG IgA and small bowel biopsies in 2007.  She has not been seen in our office for follow-up since 2014.  Ttg IgA was elevated in 2014 and Dr. Jena Gauss recommended referral to nutritionist. ***  DEXA was normal in 2014.   She also has history of mildly elevated LFTs.  The LFTs normalized in 2018 and remained normal on last labs in 2022.  Also with history of iron deficiency anemia along with low normal. B12, and folate deficiency.  Last labs on file October 2023 with hemoglobin 9.3, iron 16, iron saturation 4%, ferritin 3, B12 383, folate 2.7.  She has never had a colonoscopy.  Last EGD was in 2007 when celiac disease was confirmed. She has seen hematology for IV iron. ***  Past Medical History:  Diagnosis Date   Celiac disease 2007   Biopsy proven.   Previous pregnancy with HELLP syndrome, antepartum 03/2011    Past Surgical History:  Procedure Laterality Date   CESAREAN SECTION N/A 07/03/2014   Procedure: CESAREAN SECTION;  Surgeon: Loney Laurence, MD;  Location: [facility];  Service: Obstetrics;  Laterality: N/A;   ESOPHAGOGASTRODUODENOSCOPY  06/2006   small hh, pale duodenal bulb, 2nd and 3rd portions of duodenum abnormal appearing. Bx c/w celiac disease. Positive serologies.    Current Outpatient Medications  Medication Sig Dispense Refill   escitalopram (LEXAPRO) 20 MG tablet Take 20 mg by mouth daily.     fluconazole (DIFLUCAN) 150 MG tablet 1 po q week x 4 weeks 4 tablet 0   folic acid (FOLVITE) 1 MG tablet Take 1 tablet (1 mg total) by mouth daily. (Patient not taking: Reported on 05/25/2023) 90 tablet 4   Iron, Ferrous Sulfate, 325 (65 Fe) MG  TABS Take 325 mg by mouth daily. (Patient not taking: Reported on 05/25/2023) 30 tablet 6   pantoprazole (PROTONIX) 40 MG tablet Take 1 tablet (40 mg total) by mouth daily. 30 tablet 3   No current facility-administered medications for this visit.    Allergies as of 06/13/2023   (No Known Allergies)    Family History  Problem Relation Age of Onset   Irritable bowel syndrome Mother    GER disease Father     Social History   Socioeconomic History   Marital status: Married    Spouse name: Not on file   Number of children: 1   Years of education: Not on file   Highest education level: Not on file  Occupational History   Occupation: Runner, broadcasting/film/video    Employer: [employer]  Tobacco Use   Smoking status: Former    Current packs/day: 0.50    Types: Cigarettes   Smokeless tobacco: Never   Tobacco comments:    quit last sumer  Vaping Use   Vaping status: Never Used  Substance and Sexual Activity   Alcohol use: No   Drug use: No   Sexual activity: Yes    Birth control/protection: None  Other Topics Concern   Not on file  Social History Narrative   03/2011, daughter born   Social Determinants of Health   Financial Resource Strain: Not on file  Food Insecurity: Not on file  Transportation Needs: Not on file  Physical Activity: Not on file  Stress: Not on file  Social Connections: Not on file  Intimate Partner Violence: Not on file    Review of Systems: Gen: Denies any fever, chills, fatigue, weight loss, lack of appetite.  CV: Denies chest pain, heart palpitations, peripheral edema, syncope.  Resp: Denies shortness of breath at rest or with exertion. Denies wheezing or cough.  GI: Denies dysphagia or odynophagia. Denies jaundice, hematemesis, fecal incontinence. GU : Denies urinary burning, urinary frequency, urinary hesitancy MS: Denies joint pain, muscle weakness, cramps, or limitation of movement.  Derm: Denies rash, itching, dry skin Psych: Denies  depression, anxiety, memory loss, and confusion Heme: Denies bruising, bleeding, and enlarged lymph nodes.  Physical Exam: There were no vitals taken for this visit. General:   Alert and oriented. Pleasant and cooperative. Well-nourished and well-developed.  Head:  Normocephalic and atraumatic. Eyes:  Without icterus, sclera clear and conjunctiva pink.  Ears:  Normal auditory acuity. Lungs:  Clear to auscultation bilaterally. No wheezes, rales, or rhonchi. No distress.  Heart:  S1, S2 present without murmurs appreciated.  Abdomen:  +BS, soft, non-tender and non-distended. No HSM noted. No guarding or rebound. No masses appreciated.  Rectal:  Deferred  Msk:  Symmetrical without gross deformities. Normal posture. Extremities:  Without edema. Neurologic:  Alert and  oriented x4;  grossly normal neurologically. Skin:  Intact without significant lesions or rashes. Psych:  Alert and cooperative. Normal mood and affect.    Assessment:     Plan:  ***   Ermalinda Memos, PA-C [facility] 06/13/2023

## 2023-06-13 ENCOUNTER — Ambulatory Visit: Payer: BC Managed Care – PPO | Admitting: Gastroenterology

## 2023-06-13 ENCOUNTER — Encounter: Payer: Self-pay | Admitting: *Deleted

## 2023-06-13 ENCOUNTER — Other Ambulatory Visit: Payer: Self-pay | Admitting: *Deleted

## 2023-06-13 ENCOUNTER — Encounter: Payer: Self-pay | Admitting: Gastroenterology

## 2023-06-13 VITALS — BP 106/75 | HR 73 | Temp 97.9°F | Ht 65.0 in | Wt 152.2 lb

## 2023-06-13 DIAGNOSIS — R1013 Epigastric pain: Secondary | ICD-10-CM | POA: Diagnosis not present

## 2023-06-13 DIAGNOSIS — D509 Iron deficiency anemia, unspecified: Secondary | ICD-10-CM

## 2023-06-13 DIAGNOSIS — K900 Celiac disease: Secondary | ICD-10-CM

## 2023-06-13 DIAGNOSIS — K219 Gastro-esophageal reflux disease without esophagitis: Secondary | ICD-10-CM

## 2023-06-13 MED ORDER — PEG 3350-KCL-NA BICARB-NACL 420 G PO SOLR: 4000.0000 mL | Freq: Once | ORAL | 0 refills | Status: AC

## 2023-06-13 MED ORDER — FAMOTIDINE 20 MG PO TABS: 20.0000 mg | ORAL_TABLET | Freq: Every day | ORAL | 3 refills | Status: DC

## 2023-06-13 NOTE — Patient Instructions (Addendum)
Please have blood work completed at [facility]. We will be in contacts with you with results.   Please resume following a strict gluten-free diet.  We will schedule you for an upper endoscopy with possible stretching of your esophagus and colonoscopy in the near future.  You should receive pneumonia vaccination if you have not already.  Start famotidine 20 mg at bedtime.  Continue pantoprazole 40 mg daily.  We will follow-up with you in the office after your procedures.  Do not hesitate to call sooner if you have questions or concerns.  It was very nice to meet you today!  Ermalinda Memos, PA-C [facility]

## 2023-06-15 ENCOUNTER — Other Ambulatory Visit: Payer: Self-pay | Admitting: Family Medicine

## 2023-06-20 LAB — PROTIME-INR
INR: 1 (ref 0.9–1.2)
Prothrombin Time: 11.2 s (ref 9.1–12.0)

## 2023-06-20 LAB — CBC WITH DIFFERENTIAL/PLATELET
Basophils Absolute: 0 10*3/uL (ref 0.0–0.2)
Basos: 1 %
EOS (ABSOLUTE): 0.1 10*3/uL (ref 0.0–0.4)
Eos: 1 %
Hematocrit: 43.2 % (ref 34.0–46.6)
Hemoglobin: 15.7 g/dL (ref 11.1–15.9)
Immature Grans (Abs): 0 10*3/uL (ref 0.0–0.1)
Immature Granulocytes: 0 %
Lymphocytes Absolute: 0.9 10*3/uL (ref 0.7–3.1)
Lymphs: 25 %
MCH: 36 pg — ABNORMAL HIGH (ref 26.6–33.0)
MCHC: 36.3 g/dL — ABNORMAL HIGH (ref 31.5–35.7)
MCV: 99 fL — ABNORMAL HIGH (ref 79–97)
Monocytes Absolute: 0.3 10*3/uL (ref 0.1–0.9)
Monocytes: 7 %
Neutrophils Absolute: 2.5 10*3/uL (ref 1.4–7.0)
Neutrophils: 66 %
Platelets: 198 10*3/uL (ref 150–450)
RBC: 4.36 x10E6/uL (ref 3.77–5.28)
RDW: 13 % (ref 11.7–15.4)
WBC: 3.8 10*3/uL (ref 3.4–10.8)

## 2023-06-20 LAB — IRON,TIBC AND FERRITIN PANEL
Ferritin: 25 ng/mL (ref 15–150)
Iron Saturation: 36 % (ref 15–55)
Iron: 133 ug/dL (ref 27–159)
Total Iron Binding Capacity: 373 ug/dL (ref 250–450)
UIBC: 240 ug/dL (ref 131–425)

## 2023-06-20 LAB — CAROTENE, SERUM: Carotene: 9 ug/dL (ref 3–91)

## 2023-06-20 LAB — VITAMIN B12: Vitamin B-12: 319 pg/mL (ref 232–1245)

## 2023-06-20 LAB — VITAMIN A: Vitamin A: 40 ug/dL (ref 20.1–62.0)

## 2023-06-20 LAB — TISSUE TRANSGLUTAMINASE, IGA: Transglutaminase IgA: 50 U/mL — ABNORMAL HIGH (ref 0–3)

## 2023-06-20 LAB — VITAMIN E
Vitamin E (Alpha Tocopherol): 8.1 mg/L (ref 7.0–25.1)
Vitamin E(Gamma Tocopherol): 0.9 mg/L (ref 0.5–5.5)

## 2023-06-20 LAB — ZINC: Zinc: 77 ug/dL (ref 44–115)

## 2023-06-20 LAB — FOLATE: Folate: 2.9 ng/mL — ABNORMAL LOW (ref >3.0)

## 2023-06-20 LAB — COPPER, SERUM: Copper: 127 ug/dL (ref 80–158)

## 2023-06-20 LAB — VITAMIN D 25 HYDROXY (VIT D DEFICIENCY, FRACTURES): Vit D, 25-Hydroxy: 52.5 ng/mL (ref 30.0–100.0)

## 2023-06-21 ENCOUNTER — Other Ambulatory Visit: Payer: Self-pay | Admitting: *Deleted

## 2023-06-21 DIAGNOSIS — K900 Celiac disease: Secondary | ICD-10-CM

## 2023-07-12 ENCOUNTER — Telehealth: Payer: Self-pay | Admitting: *Deleted

## 2023-07-12 NOTE — Telephone Encounter (Signed)
Pt wants to cancel the colonoscopy  and just do the EGD due to it being $4000. She says she just doesn't have the money to pay at this time. Please advise. Thank you

## 2023-07-12 NOTE — Telephone Encounter (Signed)
Noted  

## 2023-07-13 ENCOUNTER — Other Ambulatory Visit (HOSPITAL_COMMUNITY)
Admission: RE | Admit: 2023-07-13 | Discharge: 2023-07-13 | Disposition: A | Discharge status: Home / Self Care | Payer: BC Managed Care – PPO | Source: Ambulatory Visit | Attending: Gastroenterology | Admitting: Gastroenterology

## 2023-07-13 ENCOUNTER — Encounter: Payer: Self-pay | Admitting: *Deleted

## 2023-07-13 DIAGNOSIS — K449 Diaphragmatic hernia without obstruction or gangrene: Secondary | ICD-10-CM | POA: Diagnosis not present

## 2023-07-13 DIAGNOSIS — Z87891 Personal history of nicotine dependence: Secondary | ICD-10-CM | POA: Diagnosis not present

## 2023-07-13 DIAGNOSIS — R1314 Dysphagia, pharyngoesophageal phase: Secondary | ICD-10-CM | POA: Diagnosis present

## 2023-07-13 DIAGNOSIS — R1013 Epigastric pain: Secondary | ICD-10-CM | POA: Insufficient documentation

## 2023-07-13 DIAGNOSIS — K219 Gastro-esophageal reflux disease without esophagitis: Secondary | ICD-10-CM | POA: Diagnosis not present

## 2023-07-13 DIAGNOSIS — D509 Iron deficiency anemia, unspecified: Secondary | ICD-10-CM | POA: Insufficient documentation

## 2023-07-13 DIAGNOSIS — Z79899 Other long term (current) drug therapy: Secondary | ICD-10-CM | POA: Diagnosis not present

## 2023-07-13 DIAGNOSIS — K900 Celiac disease: Secondary | ICD-10-CM | POA: Diagnosis not present

## 2023-07-13 DIAGNOSIS — F419 Anxiety disorder, unspecified: Secondary | ICD-10-CM | POA: Diagnosis not present

## 2023-07-13 DIAGNOSIS — K2210 Ulcer of esophagus without bleeding: Secondary | ICD-10-CM | POA: Diagnosis not present

## 2023-07-13 LAB — PREGNANCY, URINE: Preg Test, Ur: NEGATIVE

## 2023-07-13 LAB — VITAMIN B12: Vitamin B-12: 267 pg/mL (ref 180–914)

## 2023-07-13 LAB — FOLATE: Folate: 2.4 ng/mL — ABNORMAL LOW (ref >5.9)

## 2023-07-13 NOTE — Telephone Encounter (Signed)
Colonoscopy has been taken off. New instructions sent to pt for EGD via MyChart.

## 2023-07-13 NOTE — Telephone Encounter (Signed)
Message sent to endo to take off colonoscopy.

## 2023-07-16 ENCOUNTER — Telehealth: Payer: Self-pay | Admitting: Gastroenterology

## 2023-07-16 ENCOUNTER — Ambulatory Visit (HOSPITAL_COMMUNITY): Payer: Self-pay | Admitting: Certified Registered Nurse Anesthetist

## 2023-07-16 ENCOUNTER — Ambulatory Visit (HOSPITAL_COMMUNITY)
Admission: RE | Admit: 2023-07-16 | Discharge: 2023-07-16 | Disposition: A | Discharge status: Home / Self Care | Payer: BC Managed Care – PPO | Attending: Internal Medicine | Admitting: Internal Medicine

## 2023-07-16 ENCOUNTER — Encounter (HOSPITAL_COMMUNITY)
Admission: RE | Disposition: A | Discharge status: Home / Self Care | Payer: Self-pay | Source: Home / Self Care | Attending: Internal Medicine

## 2023-07-16 ENCOUNTER — Encounter (HOSPITAL_COMMUNITY): Payer: Self-pay | Admitting: Internal Medicine

## 2023-07-16 ENCOUNTER — Other Ambulatory Visit: Payer: Self-pay

## 2023-07-16 ENCOUNTER — Telehealth: Payer: Self-pay

## 2023-07-16 ENCOUNTER — Encounter: Payer: Self-pay | Admitting: *Deleted

## 2023-07-16 DIAGNOSIS — Z79899 Other long term (current) drug therapy: Secondary | ICD-10-CM | POA: Insufficient documentation

## 2023-07-16 DIAGNOSIS — K219 Gastro-esophageal reflux disease without esophagitis: Secondary | ICD-10-CM | POA: Insufficient documentation

## 2023-07-16 DIAGNOSIS — R1310 Dysphagia, unspecified: Secondary | ICD-10-CM

## 2023-07-16 DIAGNOSIS — R1314 Dysphagia, pharyngoesophageal phase: Secondary | ICD-10-CM | POA: Diagnosis not present

## 2023-07-16 DIAGNOSIS — K900 Celiac disease: Secondary | ICD-10-CM | POA: Insufficient documentation

## 2023-07-16 DIAGNOSIS — F419 Anxiety disorder, unspecified: Secondary | ICD-10-CM | POA: Insufficient documentation

## 2023-07-16 DIAGNOSIS — Z87891 Personal history of nicotine dependence: Secondary | ICD-10-CM | POA: Insufficient documentation

## 2023-07-16 DIAGNOSIS — K2210 Ulcer of esophagus without bleeding: Secondary | ICD-10-CM | POA: Insufficient documentation

## 2023-07-16 DIAGNOSIS — K449 Diaphragmatic hernia without obstruction or gangrene: Secondary | ICD-10-CM | POA: Insufficient documentation

## 2023-07-16 HISTORY — PX: BIOPSY: SHX5522

## 2023-07-16 HISTORY — PX: ESOPHAGOGASTRODUODENOSCOPY (EGD) WITH PROPOFOL: SHX5813

## 2023-07-16 HISTORY — PX: MALONEY DILATION: SHX5535

## 2023-07-16 SURGERY — ESOPHAGOGASTRODUODENOSCOPY (EGD) WITH PROPOFOL
Anesthesia: General

## 2023-07-16 MED ORDER — PROPOFOL 500 MG/50ML IV EMUL
INTRAVENOUS | Status: AC
  Filled 2023-07-16: qty 50

## 2023-07-16 MED ORDER — LACTATED RINGERS IV SOLN: INTRAVENOUS | Status: DC

## 2023-07-16 MED ORDER — PANTOPRAZOLE SODIUM 40 MG PO TBEC: 40.0000 mg | DELAYED_RELEASE_TABLET | Freq: Two times a day (BID) | ORAL | 11 refills | Status: DC

## 2023-07-16 MED ORDER — PROPOFOL 10 MG/ML IV BOLUS
INTRAVENOUS | Status: DC | PRN
  Administered 2023-07-16: 40 mg via INTRAVENOUS
  Administered 2023-07-16: 80 mg via INTRAVENOUS

## 2023-07-16 MED ORDER — PROPOFOL 500 MG/50ML IV EMUL
INTRAVENOUS | Status: DC | PRN
  Administered 2023-07-16: 180 ug/kg/min via INTRAVENOUS

## 2023-07-16 MED ORDER — LIDOCAINE HCL (PF) 2 % IJ SOLN
INTRAMUSCULAR | Status: DC | PRN
Start: 2023-07-16 — End: 2023-07-16
  Administered 2023-07-16: 50 mg via INTRADERMAL

## 2023-07-16 NOTE — Discharge Instructions (Signed)
EGD Discharge instructions Please read the instructions outlined below and refer to this sheet in the next few weeks. These discharge instructions provide you with general information on caring for yourself after you leave the hospital. Your doctor may also give you specific instructions. While your treatment has been planned according to the most current medical practices available, unavoidable complications occasionally occur. If you have any problems or questions after discharge, please call your doctor. ACTIVITY You may resume your regular activity but move at a slower pace for the next 24 hours.  Take frequent rest periods for the next 24 hours.  Walking will help expel (get rid of) the air and reduce the bloated feeling in your abdomen.  No driving for 24 hours (because of the anesthesia (medicine) used during the test).  You may shower.  Do not sign any important legal documents or operate any machinery for 24 hours (because of the anesthesia used during the test).  NUTRITION Drink plenty of fluids.  You may resume your normal diet.  Begin with a light meal and progress to your normal diet.  Avoid alcoholic beverages for 24 hours or as instructed by your caregiver.  MEDICATIONS You may resume your normal medications unless your caregiver tells you otherwise.  WHAT YOU CAN EXPECT TODAY You may experience abdominal discomfort such as a feeling of fullness or "gas" pains.  FOLLOW-UP Your doctor will discuss the results of your test with you.  SEEK IMMEDIATE MEDICAL ATTENTION IF ANY OF THE FOLLOWING OCCUR: Excessive nausea (feeling sick to your stomach) and/or vomiting.  Severe abdominal pain and distention (swelling).  Trouble swallowing.  Temperature over 101 F (37.8 C).  Rectal bleeding or vomiting of blood.      you have acid burns in your esophagus consistent with acid reflux.  Your esophagus was stretched and biopsied  You have a small hiatal hernia.  Your small intestine  appears abnormal consistent with active celiac disease -  also biopsied.    Recommend a gluten-free diet-strict  Increase Protonix to 40 mg twice daily before breakfast and supper.  New prescription is already been sent to your pharmacy through the office.    Further recommendations to follow pending review of pathology report  Office visit with Ermalinda Memos in 6 weeks   at patient request I called I called Apolinar Junes at [phone] -  reviewed findings and recommendations

## 2023-07-16 NOTE — Op Note (Signed)
[facility] Patient Name: Olivia Vance Procedure Date: 07/16/2023 9:01 AM MRN: [id number] Date of Birth: Oct 07, 1981 Attending MD: Gennette Pac , MD, [id number] CSN: [id number] Age: 42 Admit Type: Outpatient Procedure:                Upper GI endoscopy Indications:              Dysphagia, Celiac disease Providers:                Gennette Pac, MD, Nena Polio, RN, Elinor Parkinson Referring MD:              Medicines:                Propofol per Anesthesia Complications:            No immediate complications. Estimated Blood Loss:     Estimated blood loss was minimal. Procedure:                Pre-Anesthesia Assessment:                           - Prior to the procedure, a History and Physical                            was performed, and patient medications and                            allergies were reviewed. The patient's tolerance of                            previous anesthesia was also reviewed. The risks                            and benefits of the procedure and the sedation                            options and risks were discussed with the patient.                            All questions were answered, and informed consent                            was obtained. Prior Anticoagulants: The patient has                            taken no anticoagulant or antiplatelet agents. ASA                            Grade Assessment: II - A patient with mild systemic                            disease. After reviewing the risks and benefits,  the patient was deemed in satisfactory condition to                            undergo the procedure.                           After obtaining informed consent, the endoscope was                            passed under direct vision. Throughout the                            procedure, the patient's blood pressure, pulse, and                            oxygen saturations  were monitored continuously. The                            GIF-H190 ([id number]) scope was introduced through the                            mouth, and advanced to the second part of duodenum.                            The upper GI endoscopy was accomplished without                            difficulty. The patient tolerated the procedure                            well. Scope In: 9:20:45 AM Scope Out: 9:31:42 AM Total Procedure Duration: 0 hours 10 minutes 57 seconds  Findings:      Distal esophageal erosions within 5 mm of the GE junction. Tubular       esophagus otherwise appeared normal.      Gastric cavity empty. A small hiatal hernia was present.       Normal-appearing gastric mucosa. Patent pylorus. Abnormal D2 and D3       mucosa with gross scalloping. Biopsies taken.      Scope was withdrawn. A 54 French Maloney dilator was passed to full       insertion with mild resistance. A look back revealed no complications.       Finally, biopsies of the mid and distal esophagus were taken for       histologic study. Impression:               - Couple of distal esophageal erosions consistent                            with mild erosive reflux esophagitis. Status post                            Maloney dilation and esophageal biopsy.                           -Small hiatal hernia.                           -  Abnormal duodenum consistent with celiac                            disease?"status post biopsy Moderate Sedation:      Moderate (conscious) sedation was personally administered by an       anesthesia professional. The following parameters were monitored: oxygen       saturation, heart rate, blood pressure, respiratory rate, EKG, adequacy       of pulmonary ventilation, and response to care. Recommendation:           - Patient has a contact number available for                            emergencies. The signs and symptoms of potential                            delayed  complications were discussed with the                            patient. Return to normal activities tomorrow.                            Written discharge instructions were provided to the                            patient.                           - Advance diet as tolerated.                           - Continue present medications; but increase                            Protonix to 40 mg twice daily before meals..                           - Return to my office in 6 weeks. Procedure Code(s):        --- Professional ---                           [phone], Esophagogastroduodenoscopy, flexible,                            transoral; diagnostic, including collection of                            specimen(s) by brushing or washing, when performed                            (separate procedure) Diagnosis Code(s):        --- Professional ---                           K44.9, Diaphragmatic hernia without obstruction or  gangrene                           R13.10, Dysphagia, unspecified                           K90.0, Celiac disease CPT copyright 2022 American Medical Association. All rights reserved. The codes documented in this report are preliminary and upon coder review may  be revised to meet current compliance requirements. Gerrit Friends. Henri Guedes, MD Gennette Pac, MD 07/16/2023 9:50:19 AM This report has been signed electronically. Number of Addenda: 0

## 2023-07-16 NOTE — H&P (Signed)
@LOGO @   Primary Care Physician:  Sonny Masters, FNP Primary Gastroenterologist:  Dr. Jena Gauss  Pre-Procedure History & Physical: HPI:  Olivia Vance is a 42 y.o. female here for  further evaluation of vague intermittent esophageal dysphagia and symptoms consistent with poorly controlled celiac disease.  Patient admits to poor compliance on a gluten-free diet.  Past Medical History:  Diagnosis Date   Celiac disease 2007   Biopsy proven.   Previous pregnancy with HELLP syndrome, antepartum 03/2011    Past Surgical History:  Procedure Laterality Date   CESAREAN SECTION N/A 07/03/2014   Procedure: CESAREAN SECTION;  Surgeon: Loney Laurence, MD;  Location: [facility];  Service: Obstetrics;  Laterality: N/A;   ESOPHAGOGASTRODUODENOSCOPY  06/2006   small hh, pale duodenal bulb, 2nd and 3rd portions of duodenum abnormal appearing. Bx c/w celiac disease. Positive serologies.    Prior to Admission medications   Medication Sig Start Date End Date Taking? Authorizing Provider  escitalopram (LEXAPRO) 20 MG tablet Take 20 mg by mouth daily.   Yes [provider]  famotidine (PEPCID) 20 MG tablet Take 1 tablet (20 mg total) by mouth at bedtime. 06/13/23  Yes Letta Median, PA-C  pantoprazole (PROTONIX) 40 MG tablet Take 1 tablet (40 mg total) by mouth daily. 11/01/22   Sonny Masters, FNP    Allergies as of 06/13/2023   (No Known Allergies)    Family History  Problem Relation Age of Onset   Irritable bowel syndrome Mother    GER disease Father    Colon cancer Neg Hx    Inflammatory bowel disease Neg Hx     Social History   Socioeconomic History   Marital status: Married    Spouse name: Not on file   Number of children: 1   Years of education: Not on file   Highest education level: Not on file  Occupational History   Occupation: Magazine features editor: [employer]  Tobacco Use   Smoking status: Former    Current packs/day: 0.50    Types: Cigarettes    Smokeless tobacco: Never   Tobacco comments:    quit last sumer  Vaping Use   Vaping status: Never Used  Substance and Sexual Activity   Alcohol use: Yes    Comment: occasional   Drug use: No   Sexual activity: Yes    Birth control/protection: None  Other Topics Concern   Not on file  Social History Narrative   03/2011, daughter born   Social Determinants of Health   Financial Resource Strain: Not on file  Food Insecurity: Not on file  Transportation Needs: Not on file  Physical Activity: Not on file  Stress: Not on file  Social Connections: Not on file  Intimate Partner Violence: Not on file    Review of Systems: See HPI, otherwise negative ROS  Physical Exam: BP 103/70   Pulse 73   Temp 98 F (36.7 C) (Oral)   Resp 16   Ht 5\' 5"  (1.651 m)   Wt 65.8 kg   LMP 07/02/2023   SpO2 97%   BMI 24.13 kg/m  General:   Alert,  Well-developed, well-nourished, pleasant and cooperative in NAD Skin:  Intact without significant lesions or rashes. Eyes:  Sclera clear, no icterus.   Conjunctiva pink. Ears:  Normal auditory acuity. Nose:  No deformity, discharge,  or lesions. Mouth:  No deformity or lesions. Neck:  Supple; no masses or thyromegaly. No significant cervical adenopathy. Lungs:  Clear throughout to auscultation.   No wheezes, crackles, or rhonchi. No acute distress. Heart:  Regular rate and rhythm; no murmurs, clicks, rubs,  or gallops. Abdomen: Non-distended, normal bowel sounds.  Soft and nontender without appreciable mass or hepatosplenomegaly.  Pulses:  Normal pulses noted. Extremities:  Without clubbing or edema.  Impression/Plan:    42 year old lady with celiac disease, intermittently compliant on the gluten-free diet.  Intermittent esophageal dysphagia solids GERD well-controlled on Protonix 40 mg once daily.  I will offer the patient a EGD with esophageal dilation as feasible/appropriate per plan.  Will likely biopsy small bowel and possibly esophagus.   Discussed with patient and husband.  The risks, benefits, limitations, alternatives and imponderables have been reviewed with the patient. Potential for esophageal dilation, biopsy, etc. have also been reviewed.  Questions have been answered. All parties agreeable.      Notice: This dictation was prepared with Dragon dictation along with smaller phrase technology. Any transcriptional errors that result from this process are unintentional and may not be corrected upon review.

## 2023-07-16 NOTE — Transfer of Care (Signed)
Immediate Anesthesia Transfer of Care Note  Patient: Olivia Vance  Procedure(s) Performed: ESOPHAGOGASTRODUODENOSCOPY (EGD) WITH PROPOFOL MALONEY DILATION BIOPSY  Patient Location: Endoscopy Unit  Anesthesia Type:General  Level of Consciousness: awake, alert , and oriented  Airway & Oxygen Therapy: Patient Spontanous Breathing  Post-op Assessment: Report given to RN, Post -op Vital signs reviewed and stable, Patient moving all extremities X 4, and Patient able to stick tongue midline  Post vital signs: Reviewed and stable  Last Vitals:  Vitals Value Taken Time  BP 98/51 07/16/23 0938  Temp 98.6   Pulse 82   Resp 15   SpO2 94 % 07/16/23 0938    Last Pain:  Vitals:   07/16/23 0817  TempSrc: Oral  PainSc: 0-No pain      Patients Stated Pain Goal: 6 (07/16/23 0817)  Complications: No notable events documented.

## 2023-07-16 NOTE — Telephone Encounter (Signed)
-----   Message from Eula Listen sent at 07/16/2023  9:33 AM EDT -----   Good morning.  New prescription Protonix 40 mg pill dispense 60 with 11 refills take 1 before breakfast and supper twice daily

## 2023-07-16 NOTE — Telephone Encounter (Signed)
Pt had procedure today and Dr. Jena Gauss wants her to follow up in 6 weeks with [facility].  I called to schedule but she asked if she could call back tomorrow when she was back at work and had her calendar in front of her.

## 2023-07-16 NOTE — Anesthesia Postprocedure Evaluation (Signed)
Anesthesia Post Note  Patient: Olivia Vance  Procedure(s) Performed: ESOPHAGOGASTRODUODENOSCOPY (EGD) WITH PROPOFOL MALONEY DILATION BIOPSY  Patient location during evaluation: Phase II Anesthesia Type: General Level of consciousness: awake Pain management: pain level controlled Vital Signs Assessment: post-procedure vital signs reviewed and stable Respiratory status: spontaneous breathing and respiratory function stable Cardiovascular status: blood pressure returned to baseline and stable Postop Assessment: no headache and no apparent nausea or vomiting Anesthetic complications: no Comments: Late entry   No notable events documented.   Last Vitals:  Vitals:   07/16/23 0817 07/16/23 0938  BP: 103/70 (!) 98/51  Pulse: 73 83  Resp: 16 18  Temp: 36.7 C 36.7 C  SpO2: 97% 94%    Last Pain:  Vitals:   07/16/23 0938  TempSrc: Oral  PainSc: 0-No pain                 Windell Norfolk

## 2023-07-16 NOTE — Anesthesia Preprocedure Evaluation (Signed)
Anesthesia Evaluation  Patient identified by MRN, date of birth, ID band Patient awake    Reviewed: Allergy & Precautions, H&P , NPO status , Patient's Chart, lab work & pertinent test results, reviewed documented beta blocker date and time   Airway Mallampati: II  TM Distance: >3 FB Neck ROM: full    Dental no notable dental hx.    Pulmonary neg pulmonary ROS, former smoker   Pulmonary exam normal breath sounds clear to auscultation       Cardiovascular Exercise Tolerance: Good negative cardio ROS  Rhythm:regular Rate:Normal     Neuro/Psych  PSYCHIATRIC DISORDERS Anxiety     negative neurological ROS  negative psych ROS   GI/Hepatic negative GI ROS, Neg liver ROS,GERD  ,,  Endo/Other  negative endocrine ROS    Renal/GU negative Renal ROS  negative genitourinary   Musculoskeletal   Abdominal   Peds  Hematology negative hematology ROS (+) Blood dyscrasia, anemia   Anesthesia Other Findings   Reproductive/Obstetrics negative OB ROS                             Anesthesia Physical Anesthesia Plan  ASA: 2  Anesthesia Plan: General   Post-op Pain Management:    Induction:   PONV Risk Score and Plan: Propofol infusion  Airway Management Planned:   Additional Equipment:   Intra-op Plan:   Post-operative Plan:   Informed Consent: I have reviewed the patients History and Physical, chart, labs and discussed the procedure including the risks, benefits and alternatives for the proposed anesthesia with the patient or authorized representative who has indicated his/her understanding and acceptance.     Dental Advisory Given  Plan Discussed with: CRNA  Anesthesia Plan Comments:        Anesthesia Quick Evaluation

## 2023-07-17 ENCOUNTER — Other Ambulatory Visit: Payer: Self-pay | Admitting: *Deleted

## 2023-07-17 DIAGNOSIS — E538 Deficiency of other specified B group vitamins: Secondary | ICD-10-CM

## 2023-07-17 LAB — SURGICAL PATHOLOGY

## 2023-07-17 NOTE — Telephone Encounter (Signed)
Noted. See lab result note.

## 2023-07-21 ENCOUNTER — Encounter: Payer: Self-pay | Admitting: Internal Medicine

## 2023-07-27 ENCOUNTER — Encounter (HOSPITAL_COMMUNITY): Payer: Self-pay | Admitting: Internal Medicine

## 2023-08-26 NOTE — Progress Notes (Unsigned)
Referring Provider: Sonny Masters, FNP Primary Care Physician:  Sonny Masters, FNP Primary GI Physician: Dr. Jena Gauss  No chief complaint on file.   HPI:   Olivia Vance is a 42 y.o. female with history of celiac disease with positive serologies and small bowel biopsies in 2007.  Also with history of iron deficiency anemia along with low normal B12 and folate deficiency.  She is presenting today for follow-up.  Last seen in the office 06/13/2023 which was the first time we had seen her since 2014.  She reported not following a gluten-free diet for the last couple of years and had been feeling fairly well.  However, over the last month, she developed intermittent, sharp, epigastric pain occurring at night.  Noted chronic history of reflux with symptoms fairly well-controlled on pantoprazole daily.  Recent episode of dysphagia.  No lower GI issues.  Recommended resuming strict gluten-free diet, update labs, proceed with EGD and colonoscopy, continue pantoprazole and start Pepcid at bedtime.  Also recommended pneumonia vaccine if she had not already received this.  Labs showed hemoglobin of 15.7 with macrocytic indices, folate 2.9 (L), B12 319, iron panel within normal limits.  TTG IgA was elevated at 50.  No evidence of zinc, vitamin C, vitamin D, vitamin A, copper, keratin deficiencies.  INR within normal limits.   Recommended starting folic acid 1 mg daily and vitamin B12 1000 mcg daily with repeat vitamin B12 and folate in 8 weeks. ***  Patient requested to cancel colonoscopy due to cost and proceed with EGD only.  EGD completed 07/16/2023: Couple of distal esophageal erosions consistent with mild erosive reflux esophagitis s/p dilation and esophageal biopsy, small hiatal hernia, abnormal duodenum consistent with celiac s/p biopsy. - Pathology: Distal esophageal biopsy showed mild chronic inflammation.  Duodenal biopsy was benign with moderate villous blunting and acute inflammation consistent  with celiac disease.  - Dr. Jena Gauss recommended PPI twice daily.  Today: Celiac disease  Low normal B12/folate deficiency:  GERD:  Epigastric pain:  Dysphagia:  Past Medical History:  Diagnosis Date   Celiac disease 2007   Biopsy proven.   Previous pregnancy with HELLP syndrome, antepartum 03/2011    Past Surgical History:  Procedure Laterality Date   BIOPSY  07/16/2023   Procedure: BIOPSY;  Surgeon: Corbin Ade, MD;  [facility];  Service: Endoscopy;;   CESAREAN SECTION N/A 07/03/2014   Procedure: CESAREAN SECTION;  Surgeon: Loney Laurence, MD;  Location: [facility];  Service: Obstetrics;  Laterality: N/A;   ESOPHAGOGASTRODUODENOSCOPY  06/2006   small hh, pale duodenal bulb, 2nd and 3rd portions of duodenum abnormal appearing. Bx c/w celiac disease. Positive serologies.   ESOPHAGOGASTRODUODENOSCOPY (EGD) WITH PROPOFOL N/A 07/16/2023   Procedure: ESOPHAGOGASTRODUODENOSCOPY (EGD) WITH PROPOFOL;  Surgeon: Corbin Ade, MD;  [facility];  Service: Endoscopy;  Laterality: N/A;   MALONEY DILATION N/A 07/16/2023   Procedure: Elease Hashimoto DILATION;  Surgeon: Corbin Ade, MD;  [facility]: [facility];  Service: Endoscopy;  Laterality: N/A;    Current Outpatient Medications  Medication Sig Dispense Refill   escitalopram (LEXAPRO) 20 MG tablet Take 20 mg by mouth daily.     famotidine (PEPCID) 20 MG tablet Take 1 tablet (20 mg total) by mouth at bedtime. 30 tablet 3   pantoprazole (PROTONIX) 40 MG tablet Take 1 tablet (40 mg total) by mouth 2 (two) times daily before a meal. 60 tablet 11   No current facility-administered medications for this visit.  Allergies as of 08/27/2023   (No Known Allergies)    Family History  Problem Relation Age of Onset   Irritable bowel syndrome Mother    GER disease Father    Colon cancer Neg Hx    Inflammatory bowel disease Neg Hx     Social History   Socioeconomic History   Marital status: Married    Spouse name:  Not on file   Number of children: 1   Years of education: Not on file   Highest education level: Not on file  Occupational History   Occupation: Magazine features editor: [employer]  Tobacco Use   Smoking status: Former    Current packs/day: 0.50    Types: Cigarettes   Smokeless tobacco: Never   Tobacco comments:    quit last sumer  Vaping Use   Vaping status: Never Used  Substance and Sexual Activity   Alcohol use: Yes    Comment: occasional   Drug use: No   Sexual activity: Yes    Birth control/protection: None  Other Topics Concern   Not on file  Social History Narrative   03/2011, daughter born   Social Determinants of Health   Financial Resource Strain: Not on file  Food Insecurity: Not on file  Transportation Needs: Not on file  Physical Activity: Not on file  Stress: Not on file  Social Connections: Not on file    Review of Systems: Gen: Denies fever, chills, anorexia. Denies fatigue, weakness, weight loss.  CV: Denies chest pain, palpitations, syncope, peripheral edema, and claudication. Resp: Denies dyspnea at rest, cough, wheezing, coughing up blood, and pleurisy. GI: Denies vomiting blood, jaundice, and fecal incontinence.   Denies dysphagia or odynophagia. Derm: Denies rash, itching, dry skin Psych: Denies depression, anxiety, memory loss, confusion. No homicidal or suicidal ideation.  Heme: Denies bruising, bleeding, and enlarged lymph nodes.  Physical Exam: There were no vitals taken for this visit. General:   Alert and oriented. No distress noted. Pleasant and cooperative.  Head:  Normocephalic and atraumatic. Eyes:  Conjuctiva clear without scleral icterus. Heart:  S1, S2 present without murmurs appreciated. Lungs:  Clear to auscultation bilaterally. No wheezes, rales, or rhonchi. No distress.  Abdomen:  +BS, soft, non-tender and non-distended. No rebound or guarding. No HSM or masses noted. Msk:  Symmetrical without gross deformities.  Normal posture. Extremities:  Without edema. Neurologic:  Alert and  oriented x4 Psych:  Normal mood and affect.    Assessment:     Plan:  ***   Ermalinda Memos, PA-C [facility] 08/27/2023

## 2023-08-27 ENCOUNTER — Encounter: Payer: Self-pay | Admitting: Gastroenterology

## 2023-08-27 ENCOUNTER — Ambulatory Visit (INDEPENDENT_AMBULATORY_CARE_PROVIDER_SITE_OTHER): Payer: BC Managed Care – PPO | Admitting: Gastroenterology

## 2023-08-27 VITALS — BP 116/78 | HR 106 | Temp 97.6°F | Ht 65.0 in | Wt 158.0 lb

## 2023-08-27 DIAGNOSIS — K900 Celiac disease: Secondary | ICD-10-CM

## 2023-08-27 DIAGNOSIS — E538 Deficiency of other specified B group vitamins: Secondary | ICD-10-CM | POA: Diagnosis not present

## 2023-08-27 DIAGNOSIS — K219 Gastro-esophageal reflux disease without esophagitis: Secondary | ICD-10-CM | POA: Diagnosis not present

## 2023-08-27 NOTE — Patient Instructions (Addendum)
Continue strict gluten-free diet.  Start folic acid 1 mg daily.  Start vitamin B12 1000 mcg daily.  We will need to repeat B12 and folate in 8 weeks to see if we are providing you with enough supplementation.  Continue pantoprazole 40 mg twice daily.  I will plan to follow-up with you in 6 months or sooner if needed.  Enjoy your trip to [place]!   Ermalinda Memos, PA-C [facility]

## 2023-09-03 ENCOUNTER — Other Ambulatory Visit: Payer: Self-pay | Admitting: *Deleted

## 2023-09-03 DIAGNOSIS — E538 Deficiency of other specified B group vitamins: Secondary | ICD-10-CM

## 2023-10-11 ENCOUNTER — Encounter: Payer: Self-pay | Admitting: Hematology

## 2023-10-29 ENCOUNTER — Encounter: Payer: Self-pay | Admitting: Hematology

## 2023-11-07 ENCOUNTER — Encounter: Payer: Self-pay | Admitting: Family Medicine

## 2023-11-07 DIAGNOSIS — L989 Disorder of the skin and subcutaneous tissue, unspecified: Secondary | ICD-10-CM

## 2023-11-08 ENCOUNTER — Encounter: Payer: Self-pay | Admitting: Family Medicine

## 2023-11-08 ENCOUNTER — Ambulatory Visit: Payer: 59 | Admitting: Family Medicine

## 2023-11-08 ENCOUNTER — Encounter: Payer: Self-pay | Admitting: Hematology

## 2023-11-08 VITALS — BP 116/78 | HR 90 | Temp 97.7°F | Ht 65.0 in | Wt 164.4 lb

## 2023-11-08 DIAGNOSIS — J0140 Acute pansinusitis, unspecified: Secondary | ICD-10-CM

## 2023-11-08 MED ORDER — AMOXICILLIN-POT CLAVULANATE 875-125 MG PO TABS
1.0000 | ORAL_TABLET | Freq: Two times a day (BID) | ORAL | 0 refills | Status: DC
Start: 2023-11-08 — End: 2023-11-15

## 2023-11-08 NOTE — Progress Notes (Signed)
 Acute Office Visit  Subjective:     Patient ID: Olivia Vance, female    DOB: [birth year], 42 y.o.   MRN: [id number]  Chief Complaint  Patient presents with   Sinusitis    Sinusitis This is a new problem. Episode onset: 7 days. The problem has been gradually worsening since onset. There has been no fever. Associated symptoms include chills, congestion, coughing, ear pain, headaches, sinus pressure and sneezing. Pertinent negatives include no shortness of breath or sore throat. Treatments tried: advil  cold and sinus, flonase , netipot. The treatment provided no relief.    Review of Systems  Constitutional:  Positive for chills.  HENT:  Positive for congestion, ear pain, sinus pressure and sneezing. Negative for sore throat.   Respiratory:  Positive for cough. Negative for shortness of breath.   Neurological:  Positive for headaches.        Objective:    BP 116/78   Pulse 90   Temp 97.7 F (36.5 C) (Temporal)   Ht 5' 5 (1.651 m)   Wt 164 lb 6.4 oz (74.6 kg)   SpO2 100%   BMI 27.36 kg/m    Physical Exam Vitals and nursing note reviewed.  Constitutional:      General: She is not in acute distress.    Appearance: She is not ill-appearing, toxic-appearing or diaphoretic.  HENT:     Right Ear: Ear canal and external ear normal. A middle ear effusion is present. Tympanic membrane is not perforated, erythematous, retracted or bulging.     Left Ear: Ear canal and external ear normal. A middle ear effusion is present. Tympanic membrane is bulging. Tympanic membrane is not perforated, erythematous or retracted.     Nose: Congestion present.     Right Sinus: Maxillary sinus tenderness and frontal sinus tenderness present.     Left Sinus: Maxillary sinus tenderness and frontal sinus tenderness present.     Mouth/Throat:     Mouth: Mucous membranes are moist.     Pharynx: Oropharynx is clear. No oropharyngeal exudate or posterior oropharyngeal erythema.  Eyes:     General:         Right eye: No discharge.        Left eye: No discharge.     Conjunctiva/sclera: Conjunctivae normal.  Cardiovascular:     Rate and Rhythm: Normal rate and regular rhythm.     Heart sounds: Normal heart sounds. No murmur heard. Pulmonary:     Effort: Pulmonary effort is normal. No respiratory distress.     Breath sounds: Normal breath sounds. No wheezing.  Musculoskeletal:     Cervical back: Neck supple. No rigidity.  Lymphadenopathy:     Cervical: No cervical adenopathy.  Skin:    General: Skin is warm and dry.  Neurological:     General: No focal deficit present.     Mental Status: She is alert and oriented to person, place, and time.  Psychiatric:        Mood and Affect: Mood normal.        Behavior: Behavior normal.     No results found for any visits on 11/08/23.      Assessment & Plan:   Olivia Vance was seen today for sinusitis.  Diagnoses and all orders for this visit:  Acute non-recurrent pansinusitis Augmentin  as below. Discussed symptomatic care and return precautions.  -     amoxicillin -clavulanate (AUGMENTIN ) 875-125 MG tablet; Take 1 tablet by mouth 2 (two) times daily for 7 days.  The patient  indicates understanding of these issues and agrees with the plan.  Olivia CHRISTELLA Search, FNP

## 2023-11-11 ENCOUNTER — Encounter: Payer: Self-pay | Admitting: Family Medicine

## 2023-11-11 DIAGNOSIS — J0140 Acute pansinusitis, unspecified: Secondary | ICD-10-CM

## 2023-11-12 MED ORDER — CEFDINIR 300 MG PO CAPS: 300.0000 mg | ORAL_CAPSULE | Freq: Two times a day (BID) | ORAL | 0 refills | Status: DC

## 2023-12-11 LAB — HM MAMMOGRAPHY

## 2023-12-12 ENCOUNTER — Encounter: Payer: Self-pay | Admitting: Hematology

## 2023-12-26 ENCOUNTER — Encounter: Payer: Self-pay | Admitting: Family Medicine

## 2024-01-16 ENCOUNTER — Encounter: Payer: Self-pay | Admitting: Gastroenterology

## 2024-08-29 ENCOUNTER — Encounter: Payer: Self-pay | Admitting: Family Medicine

## 2024-09-01 ENCOUNTER — Encounter: Payer: Self-pay | Admitting: Oncology

## 2024-09-12 ENCOUNTER — Ambulatory Visit

## 2024-09-24 ENCOUNTER — Other Ambulatory Visit: Payer: Self-pay | Admitting: Internal Medicine

## 2024-09-24 NOTE — Telephone Encounter (Signed)
 Olivia Vance patient, overdue for ov.  Please schedule with ANY APP or Dr. Shaaron.  Nonurgent ov. Refills submitted

## 2024-10-01 ENCOUNTER — Ambulatory Visit: Payer: Self-pay | Admitting: Family Medicine

## 2024-10-01 ENCOUNTER — Ambulatory Visit: Admitting: Family Medicine

## 2024-10-01 ENCOUNTER — Encounter: Payer: Self-pay | Admitting: Family Medicine

## 2024-10-01 VITALS — BP 118/79 | HR 97 | Temp 97.9°F | Ht 65.0 in | Wt 173.6 lb

## 2024-10-01 DIAGNOSIS — Z136 Encounter for screening for cardiovascular disorders: Secondary | ICD-10-CM

## 2024-10-01 DIAGNOSIS — E559 Vitamin D deficiency, unspecified: Secondary | ICD-10-CM

## 2024-10-01 DIAGNOSIS — Z130 Encounter for screening for diseases of the blood and blood-forming organs and certain disorders involving the immune mechanism: Secondary | ICD-10-CM

## 2024-10-01 DIAGNOSIS — D508 Other iron deficiency anemias: Secondary | ICD-10-CM

## 2024-10-01 DIAGNOSIS — R232 Flushing: Secondary | ICD-10-CM

## 2024-10-01 LAB — BAYER DCA HB A1C WAIVED: HB A1C (BAYER DCA - WAIVED): 5.2 % (ref 4.8–5.6)

## 2024-10-01 NOTE — Progress Notes (Signed)
 Complete physical exam  Patient: Olivia Vance   DOB: 01-11-1981   43 y.o. Female  MRN: [id number]  Subjective:    Chief Complaint  Patient presents with   Annual Exam    Olivia Vance is a 43 y.o. female who presents today for a complete physical exam. She reports consuming a general diet. Patient is very active on a daily basis and does participate in regular exercise. She generally feels well. She reports sleeping well. She does have additional problems to discuss today. She complains of weight gain and hot flashes. This has been ongoing for several months.    Most recent fall risk assessment:    11/08/2023   10:31 AM  Fall Risk   Falls in the past year? 0     Most recent depression screenings:    11/08/2023   10:31 AM 05/25/2023    3:40 PM  PHQ 2/9 Scores  PHQ - 2 Score 0 0  PHQ- 9 Score 0  0      Data saved with a previous flowsheet row definition    Vision:Not within last year  and Dental: No current dental problems and Receives regular dental care  Patient Active Problem List   Diagnosis Date Noted   Iron  deficiency anemia 09/07/2022   Dermatofibroma 09/06/2021   Nevus 09/06/2021   Subacute frontal sinusitis 08/15/2021   Gastroesophageal reflux disease 06/23/2021   GAD (generalized anxiety disorder) 06/23/2021   Postoperative state 07/03/2014   Celiac disease 07/17/2011   B12 deficiency 07/17/2011    Class: History of   Anemia 07/17/2011   Abnormal LFTs 07/17/2011   Past Medical History:  Diagnosis Date   Celiac disease 2007   Biopsy proven.   Previous pregnancy with HELLP syndrome, antepartum 03/2011   Past Surgical History:  Procedure Laterality Date   BIOPSY  07/16/2023   Procedure: BIOPSY;  Surgeon: Shaaron Lamar HERO, MD;  [facility];  Service: Endoscopy;;   CESAREAN SECTION N/A 07/03/2014   Procedure: CESAREAN SECTION;  Surgeon: Rosaline DELENA Luna, MD;  Location: [facility];  Service: Obstetrics;  Laterality: N/A;    ESOPHAGOGASTRODUODENOSCOPY  06/2006   small hh, pale duodenal bulb, 2nd and 3rd portions of duodenum abnormal appearing. Bx c/w celiac disease. Positive serologies.   ESOPHAGOGASTRODUODENOSCOPY (EGD) WITH PROPOFOL  N/A 07/16/2023   Procedure: ESOPHAGOGASTRODUODENOSCOPY (EGD) WITH PROPOFOL ;  Surgeon: Shaaron Lamar HERO, MD;  [facility];  Service: Endoscopy;  Laterality: N/A;   MALONEY DILATION N/A 07/16/2023   Procedure: AGAPITO DILATION;  Surgeon: Shaaron Lamar HERO, MD;  [facility];  Service: Endoscopy;  Laterality: N/A;   Social History   Tobacco Use   Smoking status: Former    Current packs/day: 0.50    Types: Cigarettes   Smokeless tobacco: Never   Tobacco comments:    quit last sumer  Vaping Use   Vaping status: Never Used  Substance Use Topics   Alcohol use: Yes    Comment: occasional   Drug use: No   Social History   Socioeconomic History   Marital status: Married    Spouse name: Not on file   Number of children: 1   Years of education: Not on file   Highest education level: Not on file  Occupational History   Occupation: Magazine Features Editor: [employer]  Tobacco Use   Smoking status: Former    Current packs/day: 0.50    Types: Cigarettes   Smokeless tobacco: Never  Tobacco comments:    quit last sumer  Vaping Use   Vaping status: Never Used  Substance and Sexual Activity   Alcohol use: Yes    Comment: occasional   Drug use: No   Sexual activity: Yes    Birth control/protection: None  Other Topics Concern   Not on file  Social History Narrative   03/2011, daughter born   Social Drivers of Corporate Investment Banker Strain: Not on file  Food Insecurity: Not on file  Transportation Needs: Not on file  Physical Activity: Not on file  Stress: Not on file  Social Connections: Not on file  Intimate Partner Violence: Not on file   Family Status  Relation Name Status   Mother  Alive   Father  Alive   Neg Hx  (Not  Specified)  No partnership data on file   Family History  Problem Relation Age of Onset   Irritable bowel syndrome Mother    GER disease Father    Colon cancer Neg Hx    Inflammatory bowel disease Neg Hx    No Known Allergies    Patient Care Team: Ashlyn Cabler, Rock HERO, FNP as PCP - General (Family Medicine) Shaaron, Lamar HERO, MD (Gastroenterology)   Outpatient Medications Prior to Visit  Medication Sig   escitalopram (LEXAPRO) 20 MG tablet Take 20 mg by mouth daily. (Patient taking differently: Take 10 mg by mouth daily.)   pantoprazole  (PROTONIX ) 40 MG tablet TAKE 1 TABLET BY MOUTH TWICE DAILY BEFORE MEALS   [DISCONTINUED] cefdinir  (OMNICEF ) 300 MG capsule Take 1 capsule (300 mg total) by mouth 2 (two) times daily. 1 po BID   No facility-administered medications prior to visit.    ROS per HPI      Objective:     BP 118/79   Pulse 97   Temp 97.9 F (36.6 C)   Ht 5' 5 (1.651 m)   Wt 173 lb 9.6 oz (78.7 kg)   SpO2 98%   BMI 28.89 kg/m  BP Readings from Last 3 Encounters:  10/01/24 118/79  11/08/23 116/78  08/27/23 116/78   Wt Readings from Last 3 Encounters:  10/01/24 173 lb 9.6 oz (78.7 kg)  11/08/23 164 lb 6.4 oz (74.6 kg)  08/27/23 158 lb (71.7 kg)   SpO2 Readings from Last 3 Encounters:  10/01/24 98%  11/08/23 100%  07/16/23 94%      Physical Exam Vitals and nursing note reviewed.  Constitutional:      General: She is not in acute distress.    Appearance: Normal appearance. She is well-developed, well-groomed and overweight. She is not ill-appearing, toxic-appearing or diaphoretic.  HENT:     Head: Normocephalic and atraumatic.     Jaw: There is normal jaw occlusion.     Right Ear: Hearing, tympanic membrane, ear canal and external ear normal.     Left Ear: Hearing, tympanic membrane, ear canal and external ear normal.     Nose: Nose normal.     Mouth/Throat:     Lips: Pink.     Mouth: Mucous membranes are moist.     Pharynx: Oropharynx is clear.  Uvula midline.  Eyes:     General: Lids are normal.     Extraocular Movements: Extraocular movements intact.     Conjunctiva/sclera: Conjunctivae normal.     Pupils: Pupils are equal, round, and reactive to light.  Neck:     Thyroid : No thyroid  mass, thyromegaly or thyroid  tenderness.     Vascular: No  carotid bruit or JVD.     Trachea: Trachea and phonation normal.  Cardiovascular:     Rate and Rhythm: Normal rate and regular rhythm.     Chest Wall: PMI is not displaced.     Pulses: Normal pulses.     Heart sounds: Normal heart sounds. No murmur heard.    No friction rub. No gallop.  Pulmonary:     Effort: Pulmonary effort is normal. No respiratory distress.     Breath sounds: Normal breath sounds. No wheezing.  Abdominal:     General: Bowel sounds are normal. There is no distension or abdominal bruit.     Palpations: Abdomen is soft. There is no hepatomegaly or splenomegaly.     Tenderness: There is no abdominal tenderness. There is no right CVA tenderness or left CVA tenderness.     Hernia: No hernia is present.  Musculoskeletal:        General: Normal range of motion.     Cervical back: Normal range of motion and neck supple.     Right lower leg: No edema.     Left lower leg: No edema.  Lymphadenopathy:     Cervical: No cervical adenopathy.  Skin:    General: Skin is warm and dry.     Capillary Refill: Capillary refill takes less than 2 seconds.     Coloration: Skin is not cyanotic, jaundiced or pale.     Findings: No rash.  Neurological:     General: No focal deficit present.     Mental Status: She is alert and oriented to person, place, and time.     Sensory: Sensation is intact.     Motor: Motor function is intact.     Coordination: Coordination is intact.     Gait: Gait is intact.     Deep Tendon Reflexes: Reflexes are normal and symmetric.  Psychiatric:        Attention and Perception: Attention and perception normal.        Mood and Affect: Mood and affect  normal.        Speech: Speech normal.        Behavior: Behavior normal. Behavior is cooperative.        Thought Content: Thought content normal.        Cognition and Memory: Cognition and memory normal.        Judgment: Judgment normal.       Last CBC Lab Results  Component Value Date   WBC 3.8 06/13/2023   HGB 15.7 06/13/2023   HCT 43.2 06/13/2023   MCV 99 (H) 06/13/2023   MCH 36.0 (H) 06/13/2023   RDW 13.0 06/13/2023   PLT 198 06/13/2023   Last metabolic panel Lab Results  Component Value Date   GLUCOSE 85 06/23/2021   NA 135 06/23/2021   K 4.9 06/23/2021   CL 101 06/23/2021   CO2 20 06/23/2021   BUN 6 06/23/2021   CREATININE 0.72 06/23/2021   EGFR 110 06/23/2021   CALCIUM 9.3 06/23/2021   PROT 6.2 06/23/2021   ALBUMIN 4.4 06/23/2021   LABGLOB 1.8 06/23/2021   AGRATIO 2.4 (H) 06/23/2021   BILITOT 0.3 06/23/2021   ALKPHOS 89 06/23/2021   AST 23 06/23/2021   ALT 26 06/23/2021   ANIONGAP 8 04/30/2017   Last lipids Lab Results  Component Value Date   CHOL 150 06/23/2021   HDL 37 (L) 06/23/2021   LDLCALC 96 06/23/2021   TRIG 92 06/23/2021   CHOLHDL 4.1 06/23/2021  Last thyroid  functions Lab Results  Component Value Date   TSH 2.660 06/23/2021   T4TOTAL 7.7 06/23/2021   Last vitamin D  Lab Results  Component Value Date   VD25OH 52.5 06/13/2023   Last vitamin B12 and Folate Lab Results  Component Value Date   VITAMINB12 267 07/13/2023   FOLATE 2.4 (L) 07/13/2023        Assessment & Plan:    Routine Health Maintenance and Physical Exam  Immunization History  Administered Date(s) Administered   Influenza-Unspecified 09/14/2010, 07/30/2018   Pneumococcal Polysaccharide-23 02/05/2013   Td 07/07/2011   Tdap 07/07/2011, 07/05/2014    Health Maintenance  Topic Date Due   Mammogram  Never done   DTaP/Tdap/Td (4 - Td or Tdap) 07/05/2024   COVID-19 Vaccine (1 - 2025-26 season) 10/17/2024 (Originally 06/30/2024)   Influenza Vaccine  01/27/2025  (Originally 05/30/2024)   Hepatitis B Vaccines 19-59 Average Risk (1 of 3 - 19+ 3-dose series) 10/01/2025 (Originally 07/20/2001)   HPV VACCINES (1 - 3-dose SCDM series) 10/01/2025 (Originally 07/20/2009)   Cervical Cancer Screening (HPV/Pap Cotest)  10/10/2025   Hepatitis C Screening  Completed   HIV Screening  Completed   Pneumococcal Vaccine  Aged Out   Meningococcal B Vaccine  Aged Out    Discussed health benefits of physical activity, and encouraged her to engage in regular exercise appropriate for her age and condition.  Problem List Items Addressed This Visit       Other   Anemia   Relevant Orders   Anemia Profile B   Other Visit Diagnoses       Encounter for screening for cardiovascular disorders    -  Primary   Relevant Orders   Anemia Profile B   CMP14+EGFR   Lipid panel     Screening for endocrine, nutritional, metabolic and immunity disorder       Relevant Orders   Anemia Profile B   CMP14+EGFR   Lipid panel   Hormone Panel     Vitamin D  deficiency       Relevant Orders   VITAMIN D  25 Hydroxy (Vit-D Deficiency, Fractures)     Hot flashes       Relevant Orders   Hormone Panel      Return in about 1 year (around 10/01/2025) for Annual Physical.     Rosaline Bruns, FNP

## 2024-10-12 LAB — CMP14+EGFR
ALT: 25 IU/L (ref 0–32)
AST: 23 IU/L (ref 0–40)
Albumin: 4.3 g/dL (ref 3.9–4.9)
Alkaline Phosphatase: 78 IU/L (ref 41–116)
BUN/Creatinine Ratio: 10 (ref 9–23)
BUN: 7 mg/dL (ref 6–24)
Bilirubin Total: 0.2 mg/dL (ref 0.0–1.2)
CO2: 19 mmol/L — ABNORMAL LOW (ref 20–29)
Calcium: 9.3 mg/dL (ref 8.7–10.2)
Chloride: 103 mmol/L (ref 96–106)
Creatinine, Ser: 0.69 mg/dL (ref 0.57–1.00)
Globulin, Total: 2.1 g/dL (ref 1.5–4.5)
Glucose: 89 mg/dL (ref 70–99)
Potassium: 4.6 mmol/L (ref 3.5–5.2)
Sodium: 137 mmol/L (ref 134–144)
Total Protein: 6.4 g/dL (ref 6.0–8.5)
eGFR: 111 mL/min/1.73 (ref >59)

## 2024-10-12 LAB — LIPID PANEL
Chol/HDL Ratio: 4.9 ratio — ABNORMAL HIGH (ref 0.0–4.4)
Cholesterol, Total: 228 mg/dL — ABNORMAL HIGH (ref 100–199)
HDL: 47 mg/dL (ref >39)
LDL Chol Calc (NIH): 164 mg/dL — ABNORMAL HIGH (ref 0–99)
Triglycerides: 98 mg/dL (ref 0–149)
VLDL Cholesterol Cal: 17 mg/dL (ref 5–40)

## 2024-10-12 LAB — HORMONE PANEL (T4,TSH,FSH,TESTT,SHBG,DHEA,ETC)
DHEA-Sulfate, LCMS: 101 ug/dL
Estradiol, Serum, MS: 271 pg/mL
Estrone Sulfate: 499 ng/dL — AB
Follicle Stimulating Hormone: 16 m[IU]/mL
Free T-3: 3.8 pg/mL
Free Testosterone, Serum: 2 pg/mL
Progesterone, Serum: 58 ng/dL
Sex Hormone Binding Globulin: 90.6 nmol/L
T4: 8.6 ug/dL
TSH: 3.4 uU/mL
Testosterone, Serum (Total): 25 ng/dL
Testosterone-% Free: 0.8 %
Triiodothyronine (T-3), Serum: 129 ng/dL

## 2024-10-12 LAB — ANEMIA PROFILE B
Basophils Absolute: 0 x10E3/uL (ref 0.0–0.2)
Basos: 1 %
EOS (ABSOLUTE): 0.1 x10E3/uL (ref 0.0–0.4)
Eos: 2 %
Ferritin: 16 ng/mL (ref 15–150)
Folate: 4.2 ng/mL (ref >3.0)
Hematocrit: 45.1 % (ref 34.0–46.6)
Hemoglobin: 14.6 g/dL (ref 11.1–15.9)
Immature Grans (Abs): 0 x10E3/uL (ref 0.0–0.1)
Immature Granulocytes: 0 %
Iron Saturation: 36 % (ref 15–55)
Iron: 136 ug/dL (ref 27–159)
Lymphocytes Absolute: 1.1 x10E3/uL (ref 0.7–3.1)
Lymphs: 31 %
MCH: 31.7 pg (ref 26.6–33.0)
MCHC: 32.4 g/dL (ref 31.5–35.7)
MCV: 98 fL — ABNORMAL HIGH (ref 79–97)
Monocytes Absolute: 0.4 x10E3/uL (ref 0.1–0.9)
Monocytes: 10 %
Neutrophils Absolute: 1.9 x10E3/uL (ref 1.4–7.0)
Neutrophils: 55 %
Platelets: 164 x10E3/uL (ref 150–450)
RBC: 4.6 x10E6/uL (ref 3.77–5.28)
RDW: 13.8 % (ref 11.7–15.4)
Retic Ct Pct: 1.5 % (ref 0.6–2.6)
Total Iron Binding Capacity: 378 ug/dL (ref 250–450)
UIBC: 242 ug/dL (ref 131–425)
Vitamin B-12: 383 pg/mL (ref 232–1245)
WBC: 3.5 x10E3/uL (ref 3.4–10.8)

## 2024-10-12 LAB — VITAMIN D 25 HYDROXY (VIT D DEFICIENCY, FRACTURES): Vit D, 25-Hydroxy: 33.6 ng/mL (ref 30.0–100.0)

## 2024-10-27 ENCOUNTER — Encounter: Payer: Self-pay | Admitting: *Deleted

## 2024-11-19 ENCOUNTER — Ambulatory Visit: Admitting: Gastroenterology

## 2025-01-05 ENCOUNTER — Ambulatory Visit: Admitting: Gastroenterology
